# Patient Record
Sex: Female | Born: 1990 | Hispanic: No | Marital: Single | Smoking: Never smoker
Health system: Southern US, Community
[De-identification: ages and names within clinical notes are randomized; demographics above are authoritative.]

## PROBLEM LIST (undated history)

## (undated) ENCOUNTER — Inpatient Hospital Stay (HOSPITAL_COMMUNITY): Payer: Self-pay

## (undated) DIAGNOSIS — O219 Vomiting of pregnancy, unspecified: Secondary | ICD-10-CM

## (undated) DIAGNOSIS — M1990 Unspecified osteoarthritis, unspecified site: Secondary | ICD-10-CM

## (undated) DIAGNOSIS — N83209 Unspecified ovarian cyst, unspecified side: Secondary | ICD-10-CM

## (undated) DIAGNOSIS — I959 Hypotension, unspecified: Secondary | ICD-10-CM

## (undated) DIAGNOSIS — O24419 Gestational diabetes mellitus in pregnancy, unspecified control: Secondary | ICD-10-CM

## (undated) DIAGNOSIS — E282 Polycystic ovarian syndrome: Secondary | ICD-10-CM

## (undated) DIAGNOSIS — F329 Major depressive disorder, single episode, unspecified: Secondary | ICD-10-CM

## (undated) DIAGNOSIS — F419 Anxiety disorder, unspecified: Secondary | ICD-10-CM

## (undated) DIAGNOSIS — R51 Headache: Secondary | ICD-10-CM

## (undated) DIAGNOSIS — K219 Gastro-esophageal reflux disease without esophagitis: Secondary | ICD-10-CM

## (undated) DIAGNOSIS — R112 Nausea with vomiting, unspecified: Secondary | ICD-10-CM

## (undated) DIAGNOSIS — Z9889 Other specified postprocedural states: Secondary | ICD-10-CM

## (undated) DIAGNOSIS — R519 Headache, unspecified: Secondary | ICD-10-CM

## (undated) DIAGNOSIS — F32A Depression, unspecified: Secondary | ICD-10-CM

## (undated) DIAGNOSIS — D259 Leiomyoma of uterus, unspecified: Secondary | ICD-10-CM

## (undated) HISTORY — DX: Depression, unspecified: F32.A

## (undated) HISTORY — DX: Gestational diabetes mellitus in pregnancy, unspecified control: O24.419

## (undated) HISTORY — DX: Anxiety disorder, unspecified: F41.9

## (undated) HISTORY — DX: Major depressive disorder, single episode, unspecified: F32.9

## (undated) HISTORY — PX: TONSILLECTOMY: SUR1361

## (undated) HISTORY — PX: ADENOIDECTOMY: SUR15

---
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
## 2014-08-12 ENCOUNTER — Encounter (HOSPITAL_COMMUNITY): Payer: Self-pay | Admitting: Emergency Medicine

## 2014-08-12 ENCOUNTER — Emergency Department (HOSPITAL_COMMUNITY): Payer: BC Managed Care – PPO

## 2014-08-12 ENCOUNTER — Emergency Department (HOSPITAL_COMMUNITY)
Admission: EM | Admit: 2014-08-12 | Discharge: 2014-08-12 | Disposition: A | Discharge status: Home / Self Care | Payer: BC Managed Care – PPO | Attending: Emergency Medicine | Admitting: Emergency Medicine

## 2014-08-12 DIAGNOSIS — M549 Dorsalgia, unspecified: Secondary | ICD-10-CM | POA: Insufficient documentation

## 2014-08-12 DIAGNOSIS — N83209 Unspecified ovarian cyst, unspecified side: Secondary | ICD-10-CM | POA: Diagnosis not present

## 2014-08-12 DIAGNOSIS — Z3202 Encounter for pregnancy test, result negative: Secondary | ICD-10-CM | POA: Diagnosis not present

## 2014-08-12 DIAGNOSIS — N83202 Unspecified ovarian cyst, left side: Secondary | ICD-10-CM

## 2014-08-12 DIAGNOSIS — N949 Unspecified condition associated with female genital organs and menstrual cycle: Secondary | ICD-10-CM | POA: Insufficient documentation

## 2014-08-12 HISTORY — DX: Leiomyoma of uterus, unspecified: D25.9

## 2014-08-12 HISTORY — DX: Unspecified ovarian cyst, unspecified side: N83.209

## 2014-08-12 LAB — HEPATIC FUNCTION PANEL
ALBUMIN: 4 g/dL (ref 3.5–5.2)
ALT: 26 U/L (ref 0–35)
AST: 20 U/L (ref 0–37)
Alkaline Phosphatase: 71 U/L (ref 39–117)
TOTAL PROTEIN: 7.4 g/dL (ref 6.0–8.3)
Total Bilirubin: 0.3 mg/dL (ref 0.3–1.2)

## 2014-08-12 LAB — BASIC METABOLIC PANEL
Anion gap: 14 (ref 5–15)
BUN: 9 mg/dL (ref 6–23)
CALCIUM: 8.6 mg/dL (ref 8.4–10.5)
CO2: 24 mEq/L (ref 19–32)
CREATININE: 0.67 mg/dL (ref 0.50–1.10)
Chloride: 102 mEq/L (ref 96–112)
GFR calc Af Amer: >90 mL/min (ref >90)
Glucose, Bld: 98 mg/dL (ref 70–99)
Potassium: 4 mEq/L (ref 3.7–5.3)
Sodium: 140 mEq/L (ref 137–147)

## 2014-08-12 LAB — CBC
HEMATOCRIT: 42.3 % (ref 36.0–46.0)
Hemoglobin: 14.6 g/dL (ref 12.0–15.0)
MCH: 31.1 pg (ref 26.0–34.0)
MCHC: 34.5 g/dL (ref 30.0–36.0)
MCV: 90.2 fL (ref 78.0–100.0)
Platelets: 241 10*3/uL (ref 150–400)
RBC: 4.69 MIL/uL (ref 3.87–5.11)
RDW: 12.3 % (ref 11.5–15.5)
WBC: 7.2 10*3/uL (ref 4.0–10.5)

## 2014-08-12 LAB — URINALYSIS, ROUTINE W REFLEX MICROSCOPIC
BILIRUBIN URINE: NEGATIVE
Glucose, UA: NEGATIVE mg/dL
Ketones, ur: NEGATIVE mg/dL
Leukocytes, UA: NEGATIVE
NITRITE: NEGATIVE
Protein, ur: NEGATIVE mg/dL
Specific Gravity, Urine: 1.009 (ref 1.005–1.030)
UROBILINOGEN UA: 0.2 mg/dL (ref 0.0–1.0)
pH: 6.5 (ref 5.0–8.0)

## 2014-08-12 LAB — WET PREP, GENITAL
Clue Cells Wet Prep HPF POC: NONE SEEN
Trich, Wet Prep: NONE SEEN
Yeast Wet Prep HPF POC: NONE SEEN

## 2014-08-12 LAB — URINE MICROSCOPIC-ADD ON

## 2014-08-12 LAB — POC URINE PREG, ED: Preg Test, Ur: NEGATIVE

## 2014-08-12 LAB — HIV ANTIBODY (ROUTINE TESTING W REFLEX): HIV: NONREACTIVE

## 2014-08-12 LAB — LIPASE, BLOOD: Lipase: 18 U/L (ref 11–59)

## 2014-08-12 MED ORDER — MORPHINE SULFATE 2 MG/ML IJ SOLN: 2.0000 mg | Freq: Once | INTRAMUSCULAR | Status: DC

## 2014-08-12 MED ORDER — MORPHINE SULFATE 2 MG/ML IJ SOLN
2.0000 mg | Freq: Once | INTRAMUSCULAR | Status: AC
Start: 2014-08-12 — End: 2014-08-12
  Administered 2014-08-12: 2 mg via INTRAVENOUS
  Filled 2014-08-12: qty 1

## 2014-08-12 MED ORDER — IOHEXOL 300 MG/ML  SOLN
100.0000 mL | Freq: Once | INTRAMUSCULAR | Status: AC | PRN
  Administered 2014-08-12: 100 mL via INTRAVENOUS

## 2014-08-12 MED ORDER — IBUPROFEN 800 MG PO TABS: 800.0000 mg | ORAL_TABLET | Freq: Three times a day (TID) | ORAL | Status: DC

## 2014-08-12 MED ORDER — HYDROCODONE-ACETAMINOPHEN 5-325 MG PO TABS
2.0000 | ORAL_TABLET | Freq: Once | ORAL | Status: AC
  Administered 2014-08-12: 2 via ORAL
  Filled 2014-08-12: qty 2

## 2014-08-12 MED ORDER — SODIUM CHLORIDE 0.9 % IV BOLUS (SEPSIS)
1000.0000 mL | Freq: Once | INTRAVENOUS | Status: AC
  Administered 2014-08-12: 1000 mL via INTRAVENOUS

## 2014-08-12 MED ORDER — HYDROCODONE-ACETAMINOPHEN 5-325 MG PO TABS: 2.0000 | ORAL_TABLET | ORAL | Status: DC | PRN

## 2014-08-12 NOTE — Discharge Instructions (Signed)
You need to have a repeat test in 1-year to ensure that the ovarian cyst has resolved.  Ovarian Cyst An ovarian cyst is a fluid-filled sac that forms on an ovary. The ovaries are small organs that produce eggs in women. Various types of cysts can form on the ovaries. Most are not cancerous. Many do not cause problems, and they often go away on their own. Some may cause symptoms and require treatment. Common types of ovarian cysts include:  Functional cysts--These cysts may occur every month during the menstrual cycle. This is normal. The cysts usually go away with the next menstrual cycle if the woman does not get pregnant. Usually, there are no symptoms with a functional cyst.  Endometrioma cysts--These cysts form from the tissue that lines the uterus. They are also called "chocolate cysts" because they become filled with blood that turns brown. This type of cyst can cause pain in the lower abdomen during intercourse and with your menstrual period.  Cystadenoma cysts--This type develops from the cells on the outside of the ovary. These cysts can get very big and cause lower abdomen pain and pain with intercourse. This type of cyst can twist on itself, cut off its blood supply, and cause severe pain. It can also easily rupture and cause a lot of pain.  Dermoid cysts--This type of cyst is sometimes found in both ovaries. These cysts may contain different kinds of body tissue, such as skin, teeth, hair, or cartilage. They usually do not cause symptoms unless they get very big.  Theca lutein cysts--These cysts occur when too much of a certain hormone (human chorionic gonadotropin) is produced and overstimulates the ovaries to produce an egg. This is most common after procedures used to assist with the conception of a baby (in vitro fertilization). CAUSES   Fertility drugs can cause a condition in which multiple large cysts are formed on the ovaries. This is called ovarian hyperstimulation syndrome.  A  condition called polycystic ovary syndrome can cause hormonal imbalances that can lead to nonfunctional ovarian cysts. SIGNS AND SYMPTOMS  Many ovarian cysts do not cause symptoms. If symptoms are present, they may include:  Pelvic pain or pressure.  Pain in the lower abdomen.  Pain during sexual intercourse.  Increasing girth (swelling) of the abdomen.  Abnormal menstrual periods.  Increasing pain with menstrual periods.  Stopping having menstrual periods without being pregnant. DIAGNOSIS  These cysts are commonly found during a routine or annual pelvic exam. Tests may be ordered to find out more about the cyst. These tests may include:  Ultrasound.  X-ray of the pelvis.  CT scan.  MRI.  Blood tests. TREATMENT  Many ovarian cysts go away on their own without treatment. Your health care provider may want to check your cyst regularly for 2-3 months to see if it changes. For women in menopause, it is particularly important to monitor a cyst closely because of the higher rate of ovarian cancer in menopausal women. When treatment is needed, it may include any of the following:  A procedure to drain the cyst (aspiration). This may be done using a long needle and ultrasound. It can also be done through a laparoscopic procedure. This involves using a thin, lighted tube with a tiny camera on the end (laparoscope) inserted through a small incision.  Surgery to remove the whole cyst. This may be done using laparoscopic surgery or an open surgery involving a larger incision in the lower abdomen.  Hormone treatment or birth control pills.  These methods are sometimes used to help dissolve a cyst. HOME CARE INSTRUCTIONS   Only take over-the-counter or prescription medicines as directed by your health care provider.  Follow up with your health care provider as directed.  Get regular pelvic exams and Pap tests. SEEK MEDICAL CARE IF:   Your periods are late, irregular, or painful, or  they stop.  Your pelvic pain or abdominal pain does not go away.  Your abdomen becomes larger or swollen.  You have pressure on your bladder or trouble emptying your bladder completely.  You have pain during sexual intercourse.  You have feelings of fullness, pressure, or discomfort in your stomach.  You lose weight for no apparent reason.  You feel generally ill.  You become constipated.  You lose your appetite.  You develop acne.  You have an increase in body and facial hair.  You are gaining weight, without changing your exercise and eating habits.  You think you are pregnant. SEEK IMMEDIATE MEDICAL CARE IF:   You have increasing abdominal pain.  You feel sick to your stomach (nauseous), and you throw up (vomit).  You develop a fever that comes on suddenly.  You have abdominal pain during a bowel movement.  Your menstrual periods become heavier than usual. MAKE SURE YOU:  Understand these instructions.  Will watch your condition.  Will get help right away if you are not doing well or get worse. Document Released: 11/30/2005 Document Revised: 12/05/2013 Document Reviewed: 08/07/2013 [facility] Patient Information 2015 [place], [place]. This information is not intended to replace advice given to you by your health care provider. Make sure you discuss any questions you have with your health care provider.

## 2014-08-12 NOTE — ED Provider Notes (Signed)
Medical screening examination/treatment/procedure(s) were performed by non-physician practitioner and as supervising physician I was immediately available for consultation/collaboration.   EKG Interpretation None        Orpah Greek, MD 08/12/14 [phone]
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
## 2014-08-12 NOTE — ED Notes (Signed)
PT REQUESTING PAIN MEDS 

## 2014-08-12 NOTE — ED Notes (Signed)
CT aware pt finished drinking contrast 
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
## 2014-08-12 NOTE — ED Notes (Signed)
Brought pt to room with friend in tow; pt getting undressed and into a gown at this time; [place], [place] aware

## 2014-08-13 NOTE — ED Provider Notes (Signed)
Medical screening examination/treatment/procedure(s) were performed by non-physician practitioner and as supervising physician I was immediately available for consultation/collaboration.   EKG Interpretation None        Fredia Sorrow, MD 08/13/14 1126

## 2014-08-14 LAB — GC/CHLAMYDIA PROBE AMP
CT Probe RNA: NEGATIVE
GC PROBE AMP APTIMA: NEGATIVE

## 2015-02-16 ENCOUNTER — Telehealth: Payer: Self-pay | Admitting: Obstetrics and Gynecology

## 2015-02-16 NOTE — Telephone Encounter (Signed)
TC from patient of [facility]--7 weeks, with constipation.  Hx of some constipation in past, small BM today, but hasn't had "good BM" in several days.  Took single dose Zofran yesterday.  Able to drink liquids.  Has taken MOM without relief.    Recommended Dulcolax supp now, then Miralax daily. Push fluids, apple/prune juice combo heated up.  Currently in [place].  Will f/u prn.  Donnel Saxon, CNM 02/16/15 12:55

## 2015-03-01 LAB — OB RESULTS CONSOLE HEPATITIS B SURFACE ANTIGEN: Hepatitis B Surface Ag: NEGATIVE

## 2015-03-01 LAB — OB RESULTS CONSOLE RUBELLA ANTIBODY, IGM: Rubella: NON-IMMUNE/NOT IMMUNE

## 2015-03-01 LAB — OB RESULTS CONSOLE ABO/RH: RH TYPE: NEGATIVE

## 2015-03-01 LAB — OB RESULTS CONSOLE ANTIBODY SCREEN: ANTIBODY SCREEN: NEGATIVE

## 2015-03-01 LAB — OB RESULTS CONSOLE RPR: RPR: NONREACTIVE

## 2015-03-01 LAB — OB RESULTS CONSOLE HIV ANTIBODY (ROUTINE TESTING): HIV: NONREACTIVE

## 2015-03-15 ENCOUNTER — Other Ambulatory Visit: Payer: Self-pay | Admitting: Obstetrics & Gynecology

## 2015-03-15 ENCOUNTER — Other Ambulatory Visit (HOSPITAL_COMMUNITY)
Admission: RE | Admit: 2015-03-15 | Discharge: 2015-03-15 | Disposition: A | Discharge status: Home / Self Care | Payer: BLUE CROSS/BLUE SHIELD | Source: Ambulatory Visit | Attending: Obstetrics & Gynecology | Admitting: Obstetrics & Gynecology

## 2015-03-15 DIAGNOSIS — Z01419 Encounter for gynecological examination (general) (routine) without abnormal findings: Secondary | ICD-10-CM | POA: Insufficient documentation

## 2015-03-15 DIAGNOSIS — Z113 Encounter for screening for infections with a predominantly sexual mode of transmission: Secondary | ICD-10-CM | POA: Insufficient documentation

## 2015-03-27 LAB — CYTOLOGY - PAP

## 2015-03-29 ENCOUNTER — Other Ambulatory Visit (HOSPITAL_COMMUNITY): Payer: Self-pay | Admitting: Obstetrics & Gynecology

## 2015-03-29 ENCOUNTER — Ambulatory Visit (HOSPITAL_COMMUNITY)
Admission: RE | Admit: 2015-03-29 | Discharge: 2015-03-29 | Disposition: A | Discharge status: Home / Self Care | Payer: BLUE CROSS/BLUE SHIELD | Source: Ambulatory Visit | Attending: Obstetrics & Gynecology | Admitting: Obstetrics & Gynecology

## 2015-03-29 ENCOUNTER — Encounter (HOSPITAL_COMMUNITY): Payer: Self-pay

## 2015-03-29 ENCOUNTER — Encounter (HOSPITAL_COMMUNITY): Payer: Self-pay | Admitting: Obstetrics & Gynecology

## 2015-03-29 VITALS — BP 115/66 | HR 88

## 2015-03-29 DIAGNOSIS — Z3A12 12 weeks gestation of pregnancy: Secondary | ICD-10-CM | POA: Diagnosis not present

## 2015-03-29 DIAGNOSIS — Z832 Family history of diseases of the blood and blood-forming organs and certain disorders involving the immune mechanism: Secondary | ICD-10-CM | POA: Insufficient documentation

## 2015-03-29 DIAGNOSIS — Z862 Personal history of diseases of the blood and blood-forming organs and certain disorders involving the immune mechanism: Secondary | ICD-10-CM

## 2015-03-29 DIAGNOSIS — O9989 Other specified diseases and conditions complicating pregnancy, childbirth and the puerperium: Secondary | ICD-10-CM | POA: Diagnosis not present

## 2015-03-29 NOTE — Progress Notes (Signed)
24 year old, G1, at 12 weeks 4 days gestation with an EDD of 10/08/2015 seen in consult for a possible family history of protein C deficiency.   She notes that this pregnancy was unplanned, but has had no complications to date other than nausea for which she takes. No records were sent for review from her primary provider.   Her sister is about to deliver and has had numerous miscarriages in [place] (no records here) and was evaluated this pregnancy (baby has trisomy 98) with a thrombophilia panel. 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 She is not on heparin and is taking on daily 81mg  aspirin. She and her family have no evidence of DVT or PE. No history of IUGR or stillbirth or obstetric complication.   PMHx   Low blood pressure PSHx  Tonsils age 54   Fractured wrist age 55 SHx  Social alcohol. No tobacco, medications, illicit drugs or herbal medications Meds  PNV 1 po daily   Zofran 4mg  q6 prn FMHx  Thyroid mother   Cardiac, stroke MGM and MGF   No DVT or PE Allergies  Sulfa which causes chest pressure after a day or so  #1 ? Protein C deficiency in her sister  Testing was during and may be related to pregnancy. They did not feel significant enough risk to sister for anticoagulation given only a history of miscarriage. Would not recommend thrombophilia testing in the patient given family history. We discussed the incidence or Protein C in the general population, incidence of thrombotic events in pregnancy, risk of prophylactic and therapeutic anticoagulation in pregnancy, and management is controversial given no significant family history should she have Protein C on testing (which is often reduced secondary to pregnancy). She and her father appear to agree with this management.  #2 Sister with a child with Trisomy 52  We discussed her age related risk of aneuploidy. We discussed 1st and 2nd  trimester screening (biochemical and sonographic) vs diagnostic testing for the evaluation of fetal aneuploidy. Her sisters risk of recurrence is ~1-2%, but this does not put her at increased risk for a child with aneuploidy.   Questions appear answered to her and her fathers satisfaction. Precautions for the above given. Spent greater than 1/2 of 30 minute visit face to face counseling.

## 2015-07-17 ENCOUNTER — Encounter: Payer: Medicaid Other | Attending: Obstetrics & Gynecology

## 2015-07-17 VITALS — Ht 66.0 in

## 2015-07-17 DIAGNOSIS — O24419 Gestational diabetes mellitus in pregnancy, unspecified control: Secondary | ICD-10-CM | POA: Insufficient documentation

## 2015-07-17 DIAGNOSIS — Z713 Dietary counseling and surveillance: Secondary | ICD-10-CM | POA: Diagnosis not present

## 2015-07-17 NOTE — Progress Notes (Signed)
  Patient was seen on 07/17/15 for Gestational Diabetes self-management . The following learning objectives were met by the patient :   States the definition of Gestational Diabetes  States why dietary management is important in controlling blood glucose  Describes the effects of carbohydrates on blood glucose levels  Demonstrates ability to create a balanced meal plan  Demonstrates carbohydrate counting   States when to check blood glucose levels  Demonstrates proper blood glucose monitoring techniques  States the effect of stress and exercise on blood glucose levels  States the importance of limiting caffeine and abstaining from alcohol and smoking  Plan:  Aim for 2 Carb Choices per meal (30 grams) +/- 1 either way for breakfast Aim for 3 Carb Choices per meal (45 grams) +/- 1 either way from lunch and dinner Aim for 1-2 Carbs per snack Begin reading food labels for Total Carbohydrate and sugar grams of foods Consider  increasing your activity level by walking daily as tolerated Begin checking BG before breakfast and 2 hours after first bit of breakfast, lunch and dinner after  as directed by MD  Take medication  as directed by MD  Blood glucose monitor given: None - Testing per MD  Patient instructed to monitor glucose levels: FBS: 60 - <90 2 hour: <120  Patient received the following handouts:  Nutrition Diabetes and Pregnancy  Carbohydrate Counting List  Meal Planning worksheet  Patient will be seen for follow-up as needed.

## 2015-07-27 ENCOUNTER — Inpatient Hospital Stay (HOSPITAL_COMMUNITY): Payer: BLUE CROSS/BLUE SHIELD

## 2015-07-27 ENCOUNTER — Encounter (HOSPITAL_COMMUNITY): Payer: Self-pay | Admitting: *Deleted

## 2015-07-27 ENCOUNTER — Inpatient Hospital Stay (HOSPITAL_COMMUNITY)
Admission: AD | Admit: 2015-07-27 | Discharge: 2015-07-27 | Disposition: A | Discharge status: Home / Self Care | Payer: BLUE CROSS/BLUE SHIELD | Source: Ambulatory Visit | Attending: Obstetrics & Gynecology | Admitting: Obstetrics & Gynecology

## 2015-07-27 ENCOUNTER — Telehealth: Payer: Self-pay

## 2015-07-27 DIAGNOSIS — Z3A29 29 weeks gestation of pregnancy: Secondary | ICD-10-CM | POA: Insufficient documentation

## 2015-07-27 DIAGNOSIS — O1203 Gestational edema, third trimester: Secondary | ICD-10-CM | POA: Diagnosis not present

## 2015-07-27 DIAGNOSIS — M7989 Other specified soft tissue disorders: Secondary | ICD-10-CM | POA: Diagnosis not present

## 2015-07-27 DIAGNOSIS — M79609 Pain in unspecified limb: Secondary | ICD-10-CM

## 2015-07-27 DIAGNOSIS — M79606 Pain in leg, unspecified: Secondary | ICD-10-CM | POA: Diagnosis present

## 2015-07-27 DIAGNOSIS — M79605 Pain in left leg: Secondary | ICD-10-CM

## 2015-07-27 DIAGNOSIS — O26893 Other specified pregnancy related conditions, third trimester: Secondary | ICD-10-CM

## 2015-07-27 DIAGNOSIS — O9989 Other specified diseases and conditions complicating pregnancy, childbirth and the puerperium: Secondary | ICD-10-CM | POA: Insufficient documentation

## 2015-07-27 LAB — URINALYSIS, ROUTINE W REFLEX MICROSCOPIC
Bilirubin Urine: NEGATIVE
GLUCOSE, UA: NEGATIVE mg/dL
HGB URINE DIPSTICK: NEGATIVE
Ketones, ur: NEGATIVE mg/dL
Nitrite: NEGATIVE
Protein, ur: NEGATIVE mg/dL
Urobilinogen, UA: 0.2 mg/dL (ref 0.0–1.0)
pH: 6.5 (ref 5.0–8.0)

## 2015-07-27 LAB — URINE MICROSCOPIC-ADD ON

## 2015-07-27 MED ORDER — ACETAMINOPHEN 325 MG PO TABS
650.0000 mg | ORAL_TABLET | ORAL | Status: AC
  Administered 2015-07-27: 650 mg via ORAL
  Filled 2015-07-27: qty 2

## 2015-07-27 MED ORDER — CYCLOBENZAPRINE HCL 10 MG PO TABS
10.0000 mg | ORAL_TABLET | Freq: Once | ORAL | Status: AC
  Administered 2015-07-27: 10 mg via ORAL
  Filled 2015-07-27: qty 1

## 2015-07-27 MED ORDER — CYCLOBENZAPRINE HCL 10 MG PO TABS: 10.0000 mg | ORAL_TABLET | Freq: Three times a day (TID) | ORAL | Status: AC | PRN

## 2015-07-27 NOTE — MAU Provider Note (Signed)
History     CSN: [id number]  Arrival date and time: 07/27/15 0700   First Provider Initiated Contact with Patient 07/27/15 [phone]      Chief Complaint  Patient presents with  . Leg Pain   HPI Kim Davidson 24 y.o. G1P0 @[redacted]w[redacted]d  presents to MAU complaining of left leg cramping.  It woke her up at 1:30am with terrible pain - 10/10. She reports terrible pain for 10-15 minutes but she still has significant pain with pulsing and swelling of that leg.  It is limited to lower leg/calf.  She has had leg cramps before but this is the worst and this is the first time with swelling.  She could not go back to sleep due to pain.  It hurts to walk but she can move the extremity.  Denies nausea, vomiting, LOF, vaginal bleeding, dysuria.  Baby is moving well.   OB History    Gravida Para Term Preterm AB TAB SAB Ectopic Multiple Living   1               Past Medical History  Diagnosis Date  . Fibroid, uterine   . Ovarian cyst   . Gestational diabetes mellitus, antepartum     Past Surgical History  Procedure Laterality Date  . Tonsillectomy      History reviewed. No pertinent family history.  Social History  Substance Use Topics  . Smoking status: Never Smoker   . Smokeless tobacco: None  . Alcohol Use: Yes    Allergies:  Allergies  Allergen Reactions  . Demerol [Meperidine] Nausea Only  . Sulfa Antibiotics Other (See Comments)    Chest pain    Prescriptions prior to admission  Medication Sig Dispense Refill Last Dose  . acetaminophen (TYLENOL) 500 MG tablet Take 500 mg by mouth every 6 (six) hours as needed for mild pain or headache.   07/27/2015 at Unknown time  . calcium carbonate (TUMS - DOSED IN MG ELEMENTAL CALCIUM) 500 MG chewable tablet Chew 2 tablets by mouth daily.   07/27/2015 at Unknown time  . Doxylamine-Pyridoxine (DICLEGIS) 10-10 MG TBEC Take 1-2 tablets by mouth 2 (two) times daily.   07/26/2015 at Unknown time  . HYDROcodone-acetaminophen (NORCO/VICODIN) 5-325 MG  per tablet Take 2 tablets by mouth every 4 (four) hours as needed. (Patient not taking: Reported on 03/29/2015) 10 tablet 0 Not Taking  . ibuprofen (ADVIL,MOTRIN) 800 MG tablet Take 1 tablet (800 mg total) by mouth 3 (three) times daily. (Patient not taking: Reported on 03/29/2015) 21 tablet 0 Not Taking  . ondansetron (ZOFRAN) 4 MG tablet Take 4 mg by mouth every 8 (eight) hours as needed for nausea or vomiting.   prn    ROS Pertinent ROS in HPI.  All other systems are negative.   Physical Exam   Blood pressure 130/70, pulse 93, temperature 98.2 F (36.8 C), resp. rate 18, height 5\' 6"  (1.676 m), weight 282 lb 4 oz (128.028 kg), SpO2 99 %.  Physical Exam  Constitutional: She is oriented to person, place, and time. She appears well-developed and well-nourished. No distress.  HENT:  Head: Normocephalic and atraumatic.  Eyes: EOM are normal.  Neck: Normal range of motion.  Cardiovascular: Normal rate.   Right calf measuring 48cm Left calf measuring 52-53cm.   No warmth or erythema appreciated  Respiratory: Breath sounds normal. No respiratory distress.  GI: Soft. There is no tenderness.  Musculoskeletal: Normal range of motion.  Neurological: She is alert and oriented to person, place, and time.  Skin: Skin is warm and dry.    MAU Course  Procedures  MDM Fetal Tracing: Baseline:130 Variability:appropriate Accelerations: 10x10 Decelerations:none Toco:none   Discussed with Dr. Alesia Richards.  She advises for Tylenol, Flexeril and Doppler studies to eval for DVT.  Slight relief from medications.  Swelling and discomfort persist Per doppler study: no DVT.  Dr. Alesia Richards advised and agreeable to discharge with further Flexeril and f/u in office  Assessment and Plan  A:  1. Pregnancy related leg pain in third trimester, antepartum   2. Pain and swelling of left lower extremity    P: Discharge to home Warning signs discussed.  Immediately return should problems arise.  F/u in office  asap  Paticia Stack 07/27/2015, 7:54 AM

## 2015-07-27 NOTE — MAU Note (Signed)
Pt presents to MAU with complaints of left leg pain. Pt states that she woke up around 130 this morning and had a bad cramp in her left leg and then noticed it was swollen. Denies any vaginal bleeding or LOF

## 2015-07-27 NOTE — Telephone Encounter (Signed)
Patient states she is having pain in her left leg and woke up screaming at about 0130.  Patient states cramping lasted about 10-15 minutes, but she is still experiencing "severe" muscle swelling.  Patient states she has noticeable swelling in left leg compared to right.  Patient denies bruising and warmth to touch, but states it is "very tight."  Patient states she took tylenol at 0330 and pain decreased from 6-7/10 to 3-4/10.  Patient states that upon examination she has "bulging veins," but denies history of varicose veins.  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  Patient states she does to wear any type of support or compression stockings.  Patient reports active fetus and denies contractions, LOF, and VB.  Patient instructed to report to MAU for evaluation.  Patient states it will take her about 45 minutes, but that she will be in.  No other questions or concerns.  JE, CNM

## 2015-07-27 NOTE — Discharge Instructions (Signed)
Third Trimester of Pregnancy The third trimester is from week 29 through week 42, months 7 through 9. The third trimester is a time when the fetus is growing rapidly. At the end of the ninth month, the fetus is about 20 inches in length and weighs 6-10 pounds.  BODY CHANGES Your body goes through many changes during pregnancy. The changes vary from woman to woman.   Your weight will continue to increase. You can expect to gain 25-35 pounds (11-16 kg) by the end of the pregnancy.  You may begin to get stretch marks on your hips, abdomen, and breasts.  You may urinate more often because the fetus is moving lower into your pelvis and pressing on your bladder.  You may develop or continue to have heartburn as a result of your pregnancy.  You may develop constipation because certain hormones are causing the muscles that push waste through your intestines to slow down.  You may develop hemorrhoids or swollen, bulging veins (varicose veins).  You may have pelvic pain because of the weight gain and pregnancy hormones relaxing your joints between the bones in your pelvis. Backaches may result from overexertion of the muscles supporting your posture.  You may have changes in your hair. These can include thickening of your hair, rapid growth, and changes in texture. Some women also have hair loss during or after pregnancy, or hair that feels dry or thin. Your hair will most likely return to normal after your baby is born.  Your breasts will continue to grow and be tender. A yellow discharge may leak from your breasts called colostrum.  Your belly button may stick out.  You may feel short of breath because of your expanding uterus.  You may notice the fetus "dropping," or moving lower in your abdomen.  You may have a bloody mucus discharge. This usually occurs a few days to a week before labor begins.  Your cervix becomes thin and soft (effaced) near your due date. WHAT TO EXPECT AT YOUR PRENATAL  EXAMS  You will have prenatal exams every 2 weeks until week 36. Then, you will have weekly prenatal exams. During a routine prenatal visit:  You will be weighed to make sure you and the fetus are growing normally.  Your blood pressure is taken.  Your abdomen will be measured to track your baby's growth.  The fetal heartbeat will be listened to.  Any test results from the previous visit will be discussed.  You may have a cervical check near your due date to see if you have effaced. At around 36 weeks, your caregiver will check your cervix. At the same time, your caregiver will also perform a test on the secretions of the vaginal tissue. This test is to determine if a type of bacteria, Group B streptococcus, is present. Your caregiver will explain this further. Your caregiver may ask you:  What your birth plan is.  How you are feeling.  If you are feeling the baby move.  If you have had any abnormal symptoms, such as leaking fluid, bleeding, severe headaches, or abdominal cramping.  If you have any questions. Other tests or screenings that may be performed during your third trimester include:  Blood tests that check for low iron levels (anemia).  Fetal testing to check the health, activity level, and growth of the fetus. Testing is done if you have certain medical conditions or if there are problems during the pregnancy. FALSE LABOR You may feel small, irregular contractions that   eventually go away. These are called Braxton Hicks contractions, or false labor. Contractions may last for hours, days, or even weeks before true labor sets in. If contractions come at regular intervals, intensify, or become painful, it is best to be seen by your caregiver.  SIGNS OF LABOR   Menstrual-like cramps.  Contractions that are 5 minutes apart or less.  Contractions that start on the top of the uterus and spread down to the lower abdomen and back.  A sense of increased pelvic pressure or back  pain.  A watery or bloody mucus discharge that comes from the vagina. If you have any of these signs before the 37th week of pregnancy, call your caregiver right away. You need to go to the hospital to get checked immediately. HOME CARE INSTRUCTIONS   Avoid all smoking, herbs, alcohol, and unprescribed drugs. These chemicals affect the formation and growth of the baby.  Follow your caregiver's instructions regarding medicine use. There are medicines that are either safe or unsafe to take during pregnancy.  Exercise only as directed by your caregiver. Experiencing uterine cramps is a good sign to stop exercising.  Continue to eat regular, healthy meals.  Wear a good support bra for breast tenderness.  Do not use hot tubs, steam rooms, or saunas.  Wear your seat belt at all times when driving.  Avoid raw meat, uncooked cheese, cat litter boxes, and soil used by cats. These carry germs that can cause birth defects in the baby.  Take your prenatal vitamins.  Try taking a stool softener (if your caregiver approves) if you develop constipation. Eat more high-fiber foods, such as fresh vegetables or fruit and whole grains. Drink plenty of fluids to keep your urine clear or pale yellow.  Take warm sitz baths to soothe any pain or discomfort caused by hemorrhoids. Use hemorrhoid cream if your caregiver approves.  If you develop varicose veins, wear support hose. Elevate your feet for 15 minutes, 3-4 times a day. Limit salt in your diet.  Avoid heavy lifting, wear low heal shoes, and practice good posture.  Rest a lot with your legs elevated if you have leg cramps or low back pain.  Visit your dentist if you have not gone during your pregnancy. Use a soft toothbrush to brush your teeth and be gentle when you floss.  A sexual relationship may be continued unless your caregiver directs you otherwise.  Do not travel far distances unless it is absolutely necessary and only with the approval  of your caregiver.  Take prenatal classes to understand, practice, and ask questions about the labor and delivery.  Make a trial run to the hospital.  Pack your hospital bag.  Prepare the baby's nursery.  Continue to go to all your prenatal visits as directed by your caregiver. SEEK MEDICAL CARE IF:  You are unsure if you are in labor or if your water has broken.  You have dizziness.  You have mild pelvic cramps, pelvic pressure, or nagging pain in your abdominal area.  You have persistent nausea, vomiting, or diarrhea.  You have a bad smelling vaginal discharge.  You have pain with urination. SEEK IMMEDIATE MEDICAL CARE IF:   You have a fever.  You are leaking fluid from your vagina.  You have spotting or bleeding from your vagina.  You have severe abdominal cramping or pain.  You have rapid weight loss or gain.  You have shortness of breath with chest pain.  You notice sudden or extreme swelling   of your face, hands, ankles, feet, or legs.  You have not felt your baby move in over an hour.  You have severe headaches that do not go away with medicine.  You have vision changes. Document Released: 11/24/2001 Document Revised: 12/05/2013 Document Reviewed: 01/31/2013 ExitCare Patient Information 2015 [employer]. This information is not intended to replace advice given to you by your health care provider. Make sure you discuss any questions you have with your health care provider.  

## 2015-07-27 NOTE — Progress Notes (Signed)
Left lower extremity venous duplex completed.  Left:  No obvious evidence of DVT, superficial thrombosis, or Baker's cyst.  Right:  Negative for DVT in the common femoral vein.  Technically difficult study due to the patient's body habitus.  

## 2015-08-21 ENCOUNTER — Telehealth: Payer: Self-pay

## 2015-08-21 NOTE — Telephone Encounter (Signed)
Patient calls reporting feeling dizzy, clammy, and started projectile vomiting this afternoon.  Patient further states that after this incident she noticed a headache.  Patient reports blood sugars are normal at 102 (PP) and BP was 94/60. Patient reports that headache is currently 6-7/10 and that she has not attempted to take anything due to "really bad gag reflex."  Patient denies other issues and reports active fetus while denying cramping, contractions, LoF, VB, epigastic pain, SOB, double vision, and blurry vision, but states "my vision is not normal."  Patient states she has been properly hydrating, but has not had any fluids since throwing up.  Patient instructed to take tylenol and hydrate to see if headache resolves. Patient further instructed to eat if she has not had anything recently. Patient to call back if headache worsens, not improved after 3 hours, or other symptoms arise.  No questions or concerns.  JE, CNM

## 2015-09-24 LAB — OB RESULTS CONSOLE GBS: GBS: POSITIVE

## 2015-10-01 ENCOUNTER — Encounter (HOSPITAL_COMMUNITY): Payer: Self-pay | Admitting: *Deleted

## 2015-10-01 ENCOUNTER — Telehealth (HOSPITAL_COMMUNITY): Payer: Self-pay | Admitting: *Deleted

## 2015-10-01 NOTE — H&P (Signed)
  HPI: 24 y/o G1P0 @ 48w2destimated gestational age (as dated by 7 week ultrasound) presents for scheduled primary C-section due to suspected LGA and CPD. no Leaking of Fluid,   no Vaginal Bleeding,   no Uterine Contractions,  + Fetal Movement.  ROS: NO HA, NO epigastric pain, NO visual changes.    Pregnancy complicated by: 1) GDMA1- diet controlled -followed by growth and NST weekly at [facility]2) Suspected LGA -last [place]@ 364w6dvertex, posterior, EFW: 4161g (>90%) 3) Obesity: BMI: 49 4) Anxiety/Depression: previously on zoloft 5044maily, self-discontinued for part of pregnancy, plan to restart postpartum 5) O negative, s/p RhoGAM @ 28wks 6) Rubella non-immune, will need MMR postpartum   Prenatal Transfer Tool  Maternal Diabetes: Yes:  Diabetes Type:  Diet controlled Genetic Screening: Normal Maternal Ultrasounds/Referrals: Normal Fetal Ultrasounds or other Referrals:  None Maternal Substance Abuse:  No Significant Maternal Medications:  Meds include: Zoloft Significant Maternal Lab Results: Lab values include: Group B Strep positive, Rh negative   PNL:  GBS positive, Rub NON-IMMUNE, Hep B neg, RPR NR, HIV neg, GC/C neg, glucola:abnormal Blood type: O negative, antibody neg VACCINATIONS: Tdap-07/24/15 Flu-09/06/15   OBHx: primip PMHx:  obesity Meds:  PNV, zoloft (self discontinued) Allergy:   Allergies  Allergen Reactions  . Demerol [Meperidine] Nausea Only  . Sulfa Antibiotics Other (See Comments)    Chest pain   SurgHx: none SocHx:   no Tobacco, no  EtOH, no Illicit Drugs  O: Last performed in office on 10/17: BMI: 49, HR: BP: 113/71 Gen. AAOx3, NAD CV.  RRR Abd. Gravid,  no tenderness,  no rigidity,  no guarding Extr.  1+ pitting edema, no calf tenderness bilaterally  FHT: 145bpm SVE: closed/long/high   Labs: see orders  A/P:  24 73o. G1P0 @ 39w35w2d who presents for primary C-section due to LGA and suspected CPD -FWB:  Reassuring by doppler -On exam  concern for LGA with US s[place]estive of LGA >90%.  Reviewed risk and benefit of CD vs vaginal delivery.  Reviewed risk of Cesarean delivery (CD) vs concerns for labor complications including shoulder dystocia. Questions and concerns were addressed as pt wishes to proceed with primary CD.  Inform consent was obtained after reviewed of risk of injury, infection and bleeding. -CBC, T&S, accu-check to be obtained -NPO -LR @ 125cc/hr -SCDs to OR -Ancef 3g IV to OR  [place] [phone]) [phone])

## 2015-10-01 NOTE — Telephone Encounter (Signed)
Preadmission screen  

## 2015-10-02 ENCOUNTER — Inpatient Hospital Stay (HOSPITAL_COMMUNITY)
Admission: AD | Admit: 2015-10-02 | Discharge: 2015-10-02 | Disposition: A | Discharge status: Home / Self Care | Payer: Medicaid Other | Source: Ambulatory Visit | Attending: Obstetrics and Gynecology | Admitting: Obstetrics and Gynecology

## 2015-10-02 ENCOUNTER — Encounter (HOSPITAL_COMMUNITY): Payer: Self-pay | Admitting: Anesthesiology

## 2015-10-02 ENCOUNTER — Encounter (HOSPITAL_COMMUNITY): Payer: Self-pay

## 2015-10-02 ENCOUNTER — Encounter (HOSPITAL_COMMUNITY)
Admission: RE | Admit: 2015-10-02 | Discharge: 2015-10-02 | Disposition: A | Discharge status: Home / Self Care | Payer: Medicaid Other | Source: Ambulatory Visit | Attending: Obstetrics & Gynecology | Admitting: Obstetrics & Gynecology

## 2015-10-02 ENCOUNTER — Encounter (HOSPITAL_COMMUNITY): Payer: Self-pay | Admitting: *Deleted

## 2015-10-02 ENCOUNTER — Telehealth: Payer: Self-pay

## 2015-10-02 HISTORY — DX: Unspecified osteoarthritis, unspecified site: M19.90

## 2015-10-02 HISTORY — DX: Hypotension, unspecified: I95.9

## 2015-10-02 HISTORY — DX: Other specified postprocedural states: Z98.890

## 2015-10-02 HISTORY — DX: Nausea with vomiting, unspecified: R11.2

## 2015-10-02 HISTORY — DX: Gastro-esophageal reflux disease without esophagitis: K21.9

## 2015-10-02 HISTORY — DX: Headache: R51

## 2015-10-02 HISTORY — DX: Polycystic ovarian syndrome: E28.2

## 2015-10-02 HISTORY — DX: Headache, unspecified: R51.9

## 2015-10-02 HISTORY — DX: Vomiting of pregnancy, unspecified: O21.9

## 2015-10-02 LAB — CBC
HCT: 35.6 % — ABNORMAL LOW (ref 36.0–46.0)
HEMOGLOBIN: 11.8 g/dL — AB (ref 12.0–15.0)
MCH: 29.1 pg (ref 26.0–34.0)
MCHC: 33.1 g/dL (ref 30.0–36.0)
MCV: 87.7 fL (ref 78.0–100.0)
Platelets: 215 10*3/uL (ref 150–400)
RBC: 4.06 MIL/uL (ref 3.87–5.11)
RDW: 13.9 % (ref 11.5–15.5)
WBC: 12.1 10*3/uL — ABNORMAL HIGH (ref 4.0–10.5)

## 2015-10-02 LAB — BASIC METABOLIC PANEL
Anion gap: 5 (ref 5–15)
BUN: 10 mg/dL (ref 6–20)
CALCIUM: 8.6 mg/dL — AB (ref 8.9–10.3)
CO2: 20 mmol/L — ABNORMAL LOW (ref 22–32)
CREATININE: 0.6 mg/dL (ref 0.44–1.00)
Chloride: 108 mmol/L (ref 101–111)
Glucose, Bld: 78 mg/dL (ref 65–99)
Potassium: 3.8 mmol/L (ref 3.5–5.1)
SODIUM: 133 mmol/L — AB (ref 135–145)

## 2015-10-02 LAB — ABO/RH: ABO/RH(D): O NEG

## 2015-10-02 MED ORDER — DEXTROSE 5 % IV SOLN
3.0000 g | INTRAVENOUS | Status: AC
  Administered 2015-10-03: 3 g via INTRAVENOUS
  Filled 2015-10-02: qty 3000

## 2015-10-02 NOTE — Anesthesia Preprocedure Evaluation (Addendum)
Anesthesia Evaluation  Patient identified by MRN, date of birth, ID band Patient awake    Reviewed: Allergy & Precautions, H&P , NPO status , Patient's Chart, lab work & pertinent test results  History of Anesthesia Complications (+) PONV and history of anesthetic complications  Airway Mallampati: III  TM Distance: >3 FB Neck ROM: Full    Dental  (+) Teeth Intact   Pulmonary neg pulmonary ROS,    breath sounds clear to auscultation       Cardiovascular negative cardio ROS   Rhythm:Regular Rate:Normal     Neuro/Psych  Headaches,    GI/Hepatic negative GI ROS, Neg liver ROS, GERD  Medicated and Controlled,  Endo/Other  negative endocrine ROSdiabetes, GestationalMorbid obesity  Renal/GU negative Renal ROS     Musculoskeletal   Abdominal (+) + obese,   Peds  Hematology negative hematology ROS (+)   Anesthesia Other Findings   Reproductive/Obstetrics (+) Pregnancy                         Anesthesia Physical Anesthesia Plan  ASA: III  Anesthesia Plan: Spinal and Combined Spinal and Epidural   Post-op Pain Management:    Induction:   Airway Management Planned:   Additional Equipment:   Intra-op Plan:   Post-operative Plan:   Informed Consent:   Plan Discussed with: CRNA and Surgeon  Anesthesia Plan Comments:         Anesthesia Quick Evaluation

## 2015-10-02 NOTE — Patient Instructions (Signed)
Your procedure is scheduled on:  Tomorrow, October 03, 2015  Enter through the [employer] of [facility] at: 6:00 A.M.  Pick up the phone at the desk and dial [phone].  Call this number if you have problems the morning of surgery: [phone].  Remember: Do NOT eat food or drink after: MIDNIGHT TONIGHT Take these medicines the morning of surgery with a SIP OF WATER: NONE  Do NOT wear jewelry (body piercing), metal hair clips/bobby pins, or nail polish. Do NOT wear lotions, powders, or perfumes.  You may wear deoderant. Do NOT shave for 48 hours prior to surgery. Do NOT bring valuables to the hospital.  Leave suitcase in car.  After surgery it may be brought to your room.  For patients admitted to the hospital, checkout time is 11:00 AM the day of discharge.

## 2015-10-02 NOTE — Telephone Encounter (Signed)
Patient called reporting irregular contractions since 10pm.  Patient states she contractions are ow every 7-8 minutes, lasting a minute, for the last hour.  Patient reports good fetal movement and denies VB and LOF. Patient reports that she is a GDM-A1 and is scheduled for a c/s on Thursday 10/20. Patient reports that she lives over an hour away.  Patient states contraction pain is a 6 or 7 when they occur.  Patient reports that her cervix was closed the last time she was checked in the office.  Patient encouraged to hydrate and elevate then call back in 2 hours or sooner if contractions get closer together.  Patient verbalized understanding.  No other questions or concerns. JE, CNM

## 2015-10-02 NOTE — Discharge Instructions (Signed)
Keep your schedule appointment for C/S. Call Dr. Nelda Marseille with further concerns or return to MAU as necessary.

## 2015-10-02 NOTE — Patient Instructions (Signed)
Your procedure is scheduled on:  Tomorrow, October 03, 2015  Enter through the [employer] [facility] at: 6:00 A.M.  Pick up the phone at the desk and dial [phone].  Call this number if you have problems the morning of surgery: [phone].  Remember: Do NOT eat food or drink after: MIDNIGHT TONIGHT Take these medicines the morning of surgery with a SIP OF WATER: NONE  Do NOT wear jewelry (body piercing), metal hair clips/bobby pins, or nail polish. Do NOT wear lotions, powders, or perfumes.  You may wear deoderant. Do NOT shave for 48 hours prior to surgery. Do NOT bring valuables to the hospital.  Leave suitcase in car.  After surgery it may be brought to your room.  For patients admitted to the hospital, checkout time is 11:00 AM the day of discharge.

## 2015-10-02 NOTE — MAU Note (Signed)
C/O contractions. No bleeding or leaking. Planning primary C/S, scheduled for tomorrow due to gestational diabetes and suspected macrosomia.

## 2015-10-03 ENCOUNTER — Inpatient Hospital Stay (HOSPITAL_COMMUNITY): Payer: Medicaid Other | Admitting: Anesthesiology

## 2015-10-03 ENCOUNTER — Inpatient Hospital Stay (HOSPITAL_COMMUNITY): Admission: RE | Admit: 2015-10-03 | Payer: Medicaid Other | Source: Ambulatory Visit

## 2015-10-03 ENCOUNTER — Inpatient Hospital Stay (HOSPITAL_COMMUNITY)
Admission: AD | Admit: 2015-10-03 | Discharge: 2015-10-06 | DRG: 765 | Disposition: A | Discharge status: Home / Self Care | Payer: Medicaid Other | Source: Ambulatory Visit | Attending: Obstetrics & Gynecology | Admitting: Obstetrics & Gynecology

## 2015-10-03 ENCOUNTER — Encounter (HOSPITAL_COMMUNITY): Payer: Self-pay | Admitting: *Deleted

## 2015-10-03 ENCOUNTER — Encounter (HOSPITAL_COMMUNITY)
Admission: AD | Disposition: A | Discharge status: Home / Self Care | Payer: Self-pay | Source: Ambulatory Visit | Attending: Obstetrics & Gynecology

## 2015-10-03 DIAGNOSIS — K219 Gastro-esophageal reflux disease without esophagitis: Secondary | ICD-10-CM | POA: Diagnosis present

## 2015-10-03 DIAGNOSIS — O99344 Other mental disorders complicating childbirth: Secondary | ICD-10-CM | POA: Diagnosis present

## 2015-10-03 DIAGNOSIS — Z6841 Body Mass Index (BMI) 40.0 and over, adult: Secondary | ICD-10-CM | POA: Diagnosis not present

## 2015-10-03 DIAGNOSIS — O99824 Streptococcus B carrier state complicating childbirth: Secondary | ICD-10-CM | POA: Diagnosis present

## 2015-10-03 DIAGNOSIS — O9962 Diseases of the digestive system complicating childbirth: Secondary | ICD-10-CM | POA: Diagnosis present

## 2015-10-03 DIAGNOSIS — Z3A39 39 weeks gestation of pregnancy: Secondary | ICD-10-CM

## 2015-10-03 DIAGNOSIS — O26893 Other specified pregnancy related conditions, third trimester: Secondary | ICD-10-CM | POA: Diagnosis present

## 2015-10-03 DIAGNOSIS — O339 Maternal care for disproportion, unspecified: Secondary | ICD-10-CM | POA: Diagnosis present

## 2015-10-03 DIAGNOSIS — Z98891 History of uterine scar from previous surgery: Secondary | ICD-10-CM

## 2015-10-03 DIAGNOSIS — Z6791 Unspecified blood type, Rh negative: Secondary | ICD-10-CM

## 2015-10-03 DIAGNOSIS — F329 Major depressive disorder, single episode, unspecified: Secondary | ICD-10-CM | POA: Diagnosis not present

## 2015-10-03 DIAGNOSIS — Z3490 Encounter for supervision of normal pregnancy, unspecified, unspecified trimester: Secondary | ICD-10-CM

## 2015-10-03 DIAGNOSIS — O24419 Gestational diabetes mellitus in pregnancy, unspecified control: Secondary | ICD-10-CM | POA: Diagnosis present

## 2015-10-03 DIAGNOSIS — F32A Depression, unspecified: Secondary | ICD-10-CM | POA: Diagnosis not present

## 2015-10-03 DIAGNOSIS — O3663X0 Maternal care for excessive fetal growth, third trimester, not applicable or unspecified: Secondary | ICD-10-CM | POA: Diagnosis present

## 2015-10-03 DIAGNOSIS — O99214 Obesity complicating childbirth: Secondary | ICD-10-CM | POA: Diagnosis present

## 2015-10-03 DIAGNOSIS — F418 Other specified anxiety disorders: Secondary | ICD-10-CM | POA: Diagnosis present

## 2015-10-03 DIAGNOSIS — O24420 Gestational diabetes mellitus in childbirth, diet controlled: Secondary | ICD-10-CM | POA: Diagnosis present

## 2015-10-03 LAB — COMPREHENSIVE METABOLIC PANEL
ALK PHOS: 146 U/L — AB (ref 38–126)
ALT: 14 U/L (ref 14–54)
AST: 17 U/L (ref 15–41)
Albumin: 2.9 g/dL — ABNORMAL LOW (ref 3.5–5.0)
Anion gap: 11 (ref 5–15)
BILIRUBIN TOTAL: 0.5 mg/dL (ref 0.3–1.2)
BUN: 11 mg/dL (ref 6–20)
CALCIUM: 9.2 mg/dL (ref 8.9–10.3)
CHLORIDE: 107 mmol/L (ref 101–111)
CO2: 19 mmol/L — ABNORMAL LOW (ref 22–32)
CREATININE: 0.59 mg/dL (ref 0.44–1.00)
Glucose, Bld: 84 mg/dL (ref 65–99)
Potassium: 4.1 mmol/L (ref 3.5–5.1)
Sodium: 137 mmol/L (ref 135–145)
Total Protein: 6.7 g/dL (ref 6.5–8.1)

## 2015-10-03 LAB — CBC
HCT: 35.9 % — ABNORMAL LOW (ref 36.0–46.0)
HEMOGLOBIN: 12.1 g/dL (ref 12.0–15.0)
MCH: 29.4 pg (ref 26.0–34.0)
MCHC: 33.7 g/dL (ref 30.0–36.0)
MCV: 87.3 fL (ref 78.0–100.0)
Platelets: 215 10*3/uL (ref 150–400)
RBC: 4.11 MIL/uL (ref 3.87–5.11)
RDW: 14 % (ref 11.5–15.5)
WBC: 11.7 10*3/uL — ABNORMAL HIGH (ref 4.0–10.5)

## 2015-10-03 LAB — LACTATE DEHYDROGENASE: LDH: 152 U/L (ref 98–192)

## 2015-10-03 LAB — GLUCOSE, CAPILLARY
Glucose-Capillary: 78 mg/dL (ref 65–99)
Glucose-Capillary: 87 mg/dL (ref 65–99)

## 2015-10-03 LAB — RPR: RPR Ser Ql: NONREACTIVE

## 2015-10-03 LAB — PREPARE RBC (CROSSMATCH)

## 2015-10-03 LAB — URIC ACID: URIC ACID, SERUM: 6.8 mg/dL — AB (ref 2.3–6.6)

## 2015-10-03 SURGERY — Surgical Case
Anesthesia: Spinal

## 2015-10-03 MED ORDER — KETOROLAC TROMETHAMINE 30 MG/ML IJ SOLN: 30.0000 mg | Freq: Once | INTRAMUSCULAR | Status: DC | PRN

## 2015-10-03 MED ORDER — KETOROLAC TROMETHAMINE 30 MG/ML IJ SOLN
30.0000 mg | Freq: Four times a day (QID) | INTRAMUSCULAR | Status: AC | PRN
Start: 2015-10-03 — End: 2015-10-04
  Administered 2015-10-03: 30 mg via INTRAMUSCULAR

## 2015-10-03 MED ORDER — MEASLES, MUMPS & RUBELLA VAC ~~LOC~~ INJ
0.5000 mL | INJECTION | Freq: Once | SUBCUTANEOUS | Status: AC
  Administered 2015-10-06: 0.5 mL via SUBCUTANEOUS
  Filled 2015-10-03: qty 0.5

## 2015-10-03 MED ORDER — MENTHOL 3 MG MT LOZG: 1.0000 | LOZENGE | OROMUCOSAL | Status: DC | PRN

## 2015-10-03 MED ORDER — BUPIVACAINE IN DEXTROSE 0.75-8.25 % IT SOLN
INTRATHECAL | Status: DC | PRN
  Administered 2015-10-03: 1.4 mL via INTRATHECAL

## 2015-10-03 MED ORDER — PANTOPRAZOLE SODIUM 20 MG PO TBEC
20.0000 mg | DELAYED_RELEASE_TABLET | Freq: Two times a day (BID) | ORAL | Status: DC
  Administered 2015-10-03 – 2015-10-06 (×7): 20 mg via ORAL
  Filled 2015-10-03 (×9): qty 1

## 2015-10-03 MED ORDER — KETOROLAC TROMETHAMINE 30 MG/ML IJ SOLN
INTRAMUSCULAR | Status: AC
  Administered 2015-10-03: 30 mg via INTRAMUSCULAR
  Filled 2015-10-03: qty 1

## 2015-10-03 MED ORDER — CEFAZOLIN SODIUM-DEXTROSE 2-3 GM-% IV SOLR
INTRAVENOUS | Status: AC
  Filled 2015-10-03: qty 50

## 2015-10-03 MED ORDER — PHENYLEPHRINE 8 MG IN D5W 100 ML (0.08MG/ML) PREMIX OPTIME
INJECTION | INTRAVENOUS | Status: DC | PRN
  Administered 2015-10-03: 60 ug/min via INTRAVENOUS

## 2015-10-03 MED ORDER — NALOXONE HCL 2 MG/2ML IJ SOSY
1.0000 ug/kg/h | PREFILLED_SYRINGE | INTRAVENOUS | Status: DC | PRN
  Filled 2015-10-03: qty 2

## 2015-10-03 MED ORDER — LACTATED RINGERS IV SOLN
INTRAVENOUS | Status: DC | PRN
  Administered 2015-10-03 (×5): via INTRAVENOUS

## 2015-10-03 MED ORDER — NALOXONE HCL 0.4 MG/ML IJ SOLN: 0.4000 mg | INTRAMUSCULAR | Status: DC | PRN

## 2015-10-03 MED ORDER — SODIUM CHLORIDE 0.9 % IR SOLN
Status: DC | PRN
  Administered 2015-10-03: 1000 mL

## 2015-10-03 MED ORDER — LANOLIN HYDROUS EX OINT: 1.0000 "application " | TOPICAL_OINTMENT | CUTANEOUS | Status: DC | PRN

## 2015-10-03 MED ORDER — OXYCODONE-ACETAMINOPHEN 5-325 MG PO TABS
1.0000 | ORAL_TABLET | ORAL | Status: DC | PRN
  Administered 2015-10-03 – 2015-10-05 (×4): 1 via ORAL
  Filled 2015-10-03 (×4): qty 1

## 2015-10-03 MED ORDER — MORPHINE SULFATE (PF) 0.5 MG/ML IJ SOLN
INTRAMUSCULAR | Status: DC | PRN
  Administered 2015-10-03: .2 mg via EPIDURAL

## 2015-10-03 MED ORDER — MORPHINE SULFATE (PF) 0.5 MG/ML IJ SOLN
INTRAMUSCULAR | Status: AC
  Filled 2015-10-03: qty 100

## 2015-10-03 MED ORDER — ONDANSETRON HCL 4 MG/2ML IJ SOLN: 4.0000 mg | Freq: Four times a day (QID) | INTRAMUSCULAR | Status: DC | PRN

## 2015-10-03 MED ORDER — PRENATAL MULTIVITAMIN CH
1.0000 | ORAL_TABLET | Freq: Every day | ORAL | Status: DC
  Administered 2015-10-04 – 2015-10-06 (×3): 1 via ORAL
  Filled 2015-10-03 (×3): qty 1

## 2015-10-03 MED ORDER — SENNOSIDES-DOCUSATE SODIUM 8.6-50 MG PO TABS
2.0000 | ORAL_TABLET | ORAL | Status: DC
  Administered 2015-10-04 – 2015-10-06 (×3): 2 via ORAL
  Filled 2015-10-03 (×3): qty 2

## 2015-10-03 MED ORDER — OXYCODONE-ACETAMINOPHEN 5-325 MG PO TABS
2.0000 | ORAL_TABLET | ORAL | Status: DC | PRN
  Administered 2015-10-04 – 2015-10-06 (×8): 2 via ORAL
  Filled 2015-10-03 (×9): qty 2

## 2015-10-03 MED ORDER — LACTATED RINGERS IV SOLN
Freq: Once | INTRAVENOUS | Status: AC
  Administered 2015-10-03: 07:00:00 via INTRAVENOUS

## 2015-10-03 MED ORDER — PHENYLEPHRINE 8 MG IN D5W 100 ML (0.08MG/ML) PREMIX OPTIME
INJECTION | INTRAVENOUS | Status: AC
  Filled 2015-10-03: qty 100

## 2015-10-03 MED ORDER — SCOPOLAMINE 1 MG/3DAYS TD PT72
1.0000 | MEDICATED_PATCH | Freq: Once | TRANSDERMAL | Status: DC
  Administered 2015-10-03: 1.5 mg via TRANSDERMAL

## 2015-10-03 MED ORDER — MEPERIDINE HCL 25 MG/ML IJ SOLN: 6.2500 mg | INTRAMUSCULAR | Status: DC | PRN

## 2015-10-03 MED ORDER — HYDROMORPHONE HCL 1 MG/ML IJ SOLN: 0.2500 mg | INTRAMUSCULAR | Status: DC | PRN

## 2015-10-03 MED ORDER — SCOPOLAMINE 1 MG/3DAYS TD PT72
1.0000 | MEDICATED_PATCH | Freq: Once | TRANSDERMAL | Status: DC
  Filled 2015-10-03: qty 1

## 2015-10-03 MED ORDER — FENTANYL CITRATE (PF) 100 MCG/2ML IJ SOLN
INTRAMUSCULAR | Status: DC | PRN
  Administered 2015-10-03: 20 ug via INTRAVENOUS

## 2015-10-03 MED ORDER — ONDANSETRON HCL 4 MG/2ML IJ SOLN: 4.0000 mg | Freq: Three times a day (TID) | INTRAMUSCULAR | Status: DC | PRN

## 2015-10-03 MED ORDER — LACTATED RINGERS IV SOLN
INTRAVENOUS | Status: DC
  Administered 2015-10-03 (×3): via INTRAVENOUS

## 2015-10-03 MED ORDER — PROMETHAZINE HCL 25 MG/ML IJ SOLN: 6.2500 mg | INTRAMUSCULAR | Status: DC | PRN

## 2015-10-03 MED ORDER — OXYTOCIN 40 UNITS IN LACTATED RINGERS INFUSION - SIMPLE MED: 62.5000 mL/h | INTRAVENOUS | Status: AC

## 2015-10-03 MED ORDER — WITCH HAZEL-GLYCERIN EX PADS
1.0000 | MEDICATED_PAD | CUTANEOUS | Status: DC | PRN
Start: 2015-10-03 — End: 2015-10-06

## 2015-10-03 MED ORDER — NALBUPHINE HCL 10 MG/ML IJ SOLN: 5.0000 mg | Freq: Once | INTRAMUSCULAR | Status: DC | PRN

## 2015-10-03 MED ORDER — DIPHENHYDRAMINE HCL 25 MG PO CAPS: 25.0000 mg | ORAL_CAPSULE | Freq: Four times a day (QID) | ORAL | Status: DC | PRN

## 2015-10-03 MED ORDER — SODIUM CHLORIDE 0.9 % IJ SOLN: 3.0000 mL | INTRAMUSCULAR | Status: DC | PRN

## 2015-10-03 MED ORDER — DIPHENHYDRAMINE HCL 25 MG PO CAPS: 25.0000 mg | ORAL_CAPSULE | ORAL | Status: DC | PRN

## 2015-10-03 MED ORDER — IBUPROFEN 600 MG PO TABS
600.0000 mg | ORAL_TABLET | Freq: Four times a day (QID) | ORAL | Status: DC | PRN
  Administered 2015-10-04 (×2): 600 mg via ORAL

## 2015-10-03 MED ORDER — NALBUPHINE HCL 10 MG/ML IJ SOLN: 5.0000 mg | INTRAMUSCULAR | Status: DC | PRN

## 2015-10-03 MED ORDER — SIMETHICONE 80 MG PO CHEW
80.0000 mg | CHEWABLE_TABLET | ORAL | Status: DC
  Administered 2015-10-04 – 2015-10-06 (×3): 80 mg via ORAL
  Filled 2015-10-03 (×3): qty 1

## 2015-10-03 MED ORDER — LACTATED RINGERS IV SOLN
INTRAVENOUS | Status: DC
  Administered 2015-10-03: 18:00:00 via INTRAVENOUS

## 2015-10-03 MED ORDER — DIPHENHYDRAMINE HCL 50 MG/ML IJ SOLN: 12.5000 mg | INTRAMUSCULAR | Status: DC | PRN

## 2015-10-03 MED ORDER — ACETAMINOPHEN 325 MG PO TABS: 650.0000 mg | ORAL_TABLET | ORAL | Status: DC | PRN

## 2015-10-03 MED ORDER — OXYTOCIN 40 UNITS IN LACTATED RINGERS INFUSION - SIMPLE MED
INTRAVENOUS | Status: DC | PRN
  Administered 2015-10-03: 40 [IU] via INTRAVENOUS

## 2015-10-03 MED ORDER — KETOROLAC TROMETHAMINE 30 MG/ML IJ SOLN: 30.0000 mg | Freq: Four times a day (QID) | INTRAMUSCULAR | Status: AC | PRN

## 2015-10-03 MED ORDER — ONDANSETRON HCL 4 MG/2ML IJ SOLN
INTRAMUSCULAR | Status: DC | PRN
  Administered 2015-10-03: 4 mg via INTRAVENOUS

## 2015-10-03 MED ORDER — SIMETHICONE 80 MG PO CHEW: 80.0000 mg | CHEWABLE_TABLET | ORAL | Status: DC | PRN

## 2015-10-03 MED ORDER — SIMETHICONE 80 MG PO CHEW
80.0000 mg | CHEWABLE_TABLET | Freq: Three times a day (TID) | ORAL | Status: DC
  Administered 2015-10-03 – 2015-10-06 (×10): 80 mg via ORAL
  Filled 2015-10-03 (×10): qty 1

## 2015-10-03 MED ORDER — IBUPROFEN 600 MG PO TABS
600.0000 mg | ORAL_TABLET | Freq: Four times a day (QID) | ORAL | Status: DC
  Administered 2015-10-03 – 2015-10-06 (×10): 600 mg via ORAL
  Filled 2015-10-03 (×12): qty 1

## 2015-10-03 MED ORDER — FENTANYL CITRATE (PF) 100 MCG/2ML IJ SOLN
INTRAMUSCULAR | Status: AC
  Filled 2015-10-03: qty 4

## 2015-10-03 MED ORDER — OXYTOCIN 10 UNIT/ML IJ SOLN
INTRAMUSCULAR | Status: AC
  Filled 2015-10-03: qty 4

## 2015-10-03 MED ORDER — ZOLPIDEM TARTRATE 5 MG PO TABS: 5.0000 mg | ORAL_TABLET | Freq: Every evening | ORAL | Status: DC | PRN

## 2015-10-03 MED ORDER — LACTATED RINGERS IV SOLN: INTRAVENOUS | Status: DC

## 2015-10-03 MED ORDER — ACETAMINOPHEN 500 MG PO TABS
1000.0000 mg | ORAL_TABLET | Freq: Four times a day (QID) | ORAL | Status: AC
  Administered 2015-10-03 (×2): 1000 mg via ORAL
  Filled 2015-10-03 (×2): qty 2

## 2015-10-03 MED ORDER — CEFAZOLIN SODIUM-DEXTROSE 2-3 GM-% IV SOLR
INTRAVENOUS | Status: AC
Start: 2015-10-03 — End: 2015-10-03
  Filled 2015-10-03: qty 50

## 2015-10-03 MED ORDER — ONDANSETRON HCL 4 MG/2ML IJ SOLN
INTRAMUSCULAR | Status: AC
  Filled 2015-10-03: qty 2

## 2015-10-03 MED ORDER — SCOPOLAMINE 1 MG/3DAYS TD PT72
MEDICATED_PATCH | TRANSDERMAL | Status: AC
  Filled 2015-10-03: qty 1

## 2015-10-03 MED ORDER — DIBUCAINE 1 % RE OINT: 1.0000 "application " | TOPICAL_OINTMENT | RECTAL | Status: DC | PRN

## 2015-10-03 SURGICAL SUPPLY — 40 items
APL SKNCLS STERI-STRIP NONHPOA (GAUZE/BANDAGES/DRESSINGS) ×1
BENZOIN TINCTURE PRP APPL 2/3 (GAUZE/BANDAGES/DRESSINGS) ×3 IMPLANT
CLAMP CORD UMBIL (MISCELLANEOUS) IMPLANT
CLOSURE WOUND 1/2 X4 (GAUZE/BANDAGES/DRESSINGS) ×1
CLOTH BEACON ORANGE TIMEOUT ST (SAFETY) ×3 IMPLANT
DRAPE SHEET LG 3/4 BI-LAMINATE (DRAPES) IMPLANT
DRSG OPSITE POSTOP 4X10 (GAUZE/BANDAGES/DRESSINGS) ×6 IMPLANT
DRSG OPSITE POSTOP 4X12 (GAUZE/BANDAGES/DRESSINGS) ×3 IMPLANT
DURAPREP 26ML APPLICATOR (WOUND CARE) ×3 IMPLANT
ELECT REM PT RETURN 9FT ADLT (ELECTROSURGICAL) ×3
ELECTRODE REM PT RTRN 9FT ADLT (ELECTROSURGICAL) ×1 IMPLANT
EXTRACTOR VACUUM KIWI (MISCELLANEOUS) IMPLANT
GLOVE BIOGEL PI IND STRL 6.5 (GLOVE) ×1 IMPLANT
GLOVE BIOGEL PI INDICATOR 6.5 (GLOVE) ×2
GLOVE ECLIPSE 6.5 STRL STRAW (GLOVE) ×3 IMPLANT
GOWN STRL REUS W/TWL LRG LVL3 (GOWN DISPOSABLE) ×6 IMPLANT
KIT ABG SYR 3ML LUER SLIP (SYRINGE) IMPLANT
LIQUID BAND (GAUZE/BANDAGES/DRESSINGS) IMPLANT
NEEDLE HYPO 25X5/8 SAFETYGLIDE (NEEDLE) IMPLANT
NS IRRIG 1000ML POUR BTL (IV SOLUTION) ×3 IMPLANT
PACK C SECTION WH (CUSTOM PROCEDURE TRAY) ×3 IMPLANT
PAD ABD 7.5X8 STRL (GAUZE/BANDAGES/DRESSINGS) ×3 IMPLANT
PAD OB MATERNITY 4.3X12.25 (PERSONAL CARE ITEMS) ×3 IMPLANT
PENCIL SMOKE EVAC W/HOLSTER (ELECTROSURGICAL) ×3 IMPLANT
RETRACTOR TRAXI PANNICULUS (MISCELLANEOUS) ×1 IMPLANT
RTRCTR C-SECT PINK 25CM LRG (MISCELLANEOUS) ×3 IMPLANT
SPONGE GAUZE 4X4 12PLY STER LF (GAUZE/BANDAGES/DRESSINGS) ×6 IMPLANT
STRIP CLOSURE SKIN 1/2X4 (GAUZE/BANDAGES/DRESSINGS) ×2 IMPLANT
SUT PLAIN 0 NONE (SUTURE) IMPLANT
SUT PLAIN 2 0 XLH (SUTURE) ×3 IMPLANT
SUT VIC AB 0 CT1 27 (SUTURE) ×4
SUT VIC AB 0 CT1 27XBRD ANBCTR (SUTURE) ×2 IMPLANT
SUT VIC AB 0 CTX 36 (SUTURE) ×9
SUT VIC AB 0 CTX36XBRD ANBCTRL (SUTURE) ×3 IMPLANT
SUT VIC AB 2-0 CT1 27 (SUTURE) ×3
SUT VIC AB 2-0 CT1 TAPERPNT 27 (SUTURE) ×1 IMPLANT
SUT VIC AB 4-0 KS 27 (SUTURE) ×3 IMPLANT
TOWEL OR 17X24 6PK STRL BLUE (TOWEL DISPOSABLE) ×3 IMPLANT
TRAXI PANNICULUS RETRACTOR (MISCELLANEOUS) ×2
TRAY FOLEY CATH SILVER 14FR (SET/KITS/TRAYS/PACK) IMPLANT

## 2015-10-03 NOTE — Transfer of Care (Signed)
Immediate Anesthesia Transfer of Care Note  Patient: Kim Davidson  Procedure(s) Performed: Procedure(s) with comments: CESAREAN SECTION (N/A) - EDD 10/08/15 RNFA confirmed on 10/01/15 [name]  Patient Location: PACU  Anesthesia Type:Spinal  Level of Consciousness: awake, alert , oriented and patient cooperative  Airway & Oxygen Therapy: Patient Spontanous Breathing  Post-op Assessment: Report given to RN and Post -op Vital signs reviewed and stable  Post vital signs: Reviewed and stable  Last Vitals:  TEMP 98.8 BP 117/46 HR 70 RR  22 POX 637  Complications: No apparent anesthesia complications

## 2015-10-03 NOTE — Lactation Note (Signed)
This note was copied from the chart of Kim Tauni Sanks. Lactation Consultation Note  Patient Name: Kim Davidson [id number] Date: 10/03/2015 Reason for consult: Initial assessment (per mom recently attempted to latch at 34 p, baby sleepy , baby skin to skin with mom )  Baby is 56 hours old and has been to the breast x2 for latches. 15 mins , and several attempts. Voided x 1 and HNS .  LC reviewed basics , importance of skin to skin, ( which mom is presently doing ) , early feeding behaviors, and that baby's are given 24 hours to  To void and stool and pick up feedings.  LC recommended to call when baby is showing feeding cues. Mother informed of post-discharge support and given phone number to the lactation department, including services for phone call assistance; out-patient appointments; and breastfeeding support group. List of other breastfeeding resources in the community given in the handout. Encouraged mother to call for problems or concerns related to breastfeeding.   Maternal Data Does the patient have breastfeeding experience prior to this delivery?: No  Feeding Feeding Type: Breast Fed Length of feed: 0 min  LATCH Score/Interventions Latch: Too sleepy or reluctant, no latch achieved, no sucking elicited. Intervention(s): Skin to skin;Waking techniques;Teach feeding cues Intervention(s): Adjust position;Assist with latch;Breast compression  Audible Swallowing: None Intervention(s): Skin to skin;Hand expression Intervention(s): Skin to skin;Hand expression  Type of Nipple: Flat Intervention(s): No intervention needed  Comfort (Breast/Nipple): Soft / non-tender     Hold (Positioning): Assistance needed to correctly position infant at breast and maintain latch. Intervention(s): Breastfeeding basics reviewed  LATCH Score: 4  Lactation Tools Discussed/Used WIC Program: Yes (per mom [place] )   Consult Status Consult Status: Follow-up  (encouraged to page with feeding cues ) Date: 10/03/15 Follow-up type: In-patient    Myer Haff 10/03/2015, 5:29 PM

## 2015-10-03 NOTE — Anesthesia Procedure Notes (Signed)
Spinal Patient location during procedure: OR Start time: 10/03/2015 7:55 AM End time: 10/03/2015 7:57 AM Staffing Anesthesiologist: Lyn Hollingshead Performed by: anesthesiologist  Preanesthetic Checklist Completed: patient identified, surgical consent, pre-op evaluation, timeout performed, IV checked, risks and benefits discussed and monitors and equipment checked Spinal Block Patient position: sitting Prep: site prepped and draped and DuraPrep Patient monitoring: heart rate, cardiac monitor, continuous pulse ox and blood pressure Approach: midline Location: L3-4 Injection technique: single-shot Needle Needle type: Pencan  Needle gauge: 24 G Needle length: 9 cm Needle insertion depth: 9 cm Assessment Sensory level: T4

## 2015-10-03 NOTE — Anesthesia Postprocedure Evaluation (Signed)
Anesthesia Post Note  Patient: Kim Davidson  Procedure(s) Performed: Procedure(s) (LRB): CESAREAN SECTION (N/A)  Anesthesia type: Spinal  Patient location: PACU  Post pain: Pain level controlled  Post assessment: Post-op Vital signs reviewed  Last Vitals:  Filed Vitals:   10/03/15 1000  BP: 80/50  Pulse: 77  Temp:   Resp: 17    Post vital signs: Reviewed  Level of consciousness: awake  Complications: No apparent anesthesia complications

## 2015-10-03 NOTE — Progress Notes (Deleted)
PreOp Diagnosis: 1) Intrauterine pregnancy @ [redacted]w[redacted]d 2) Suspected LGA and CPD 3) Gestational DM 4) Maternal obesity PostOp Diagnosis: same Procedure: Primary C-section Surgeon: Dr. [name] Assistant: Charlestine Massed, RNFA Anesthesia: spinal Complications: none EBL: 700cc UOP: 200cc Fluids: 3300  Findings: Female infant from vertex presentation, APGARS 9,9, Weight: 9#2oz, Normal uterus, tubes and ovaries bilaterally  PROCEDURE:  Informed consent was obtained from the patient with risks, benefits, complications, treatment options, and expected outcomes discussed with the patient.  The patient concurred with the proposed plan, giving informed consent with form signed.   The patient was taken to Operating Room, and identified with the procedure verified as C-Section Delivery with Time Out. A Traxi was applied to assist in retraction.  With induction of anesthesia, the patient was prepped and draped in the usual sterile fashion. A Pfannenstiel incision was made and carried down through the subcutaneous tissue to the fascia. The fascia was incised in the midline and extended transversely. The superior aspect of the fascial incision was grasped with Kochers elevated and the underlying muscle dissected off. The inferior aspect of the facial incision was in similar fashion, grasped elevated and rectus muscles dissected off. The peritoneum was identified and entered. Peritoneal incision was extended longitudinally. The Alexis retractor was placed. The utero-vesical peritoneal reflection was identified and incised transversely with the [facility] scissors, the incision extended laterally, the bladder flap created digitally. A low transverse uterine incision was made and the infants head delivered atraumatically. After the umbilical cord was clamped and cut cord blood was obtained for evaluation.   The placenta was removed intact and appeared normal. The uterine outline, tubes and ovaries appeared normal. The  uterine incision was closed with running locked sutures of 0 Vicryl and a second layer of the same stitch was used in an imbricating fashion.  Excellent hemostasis was obtained.  The pericolic gutters were then cleared of all clots and debris. The Alexis retractor was removed.  The peritoneum was closed in a running fashion. The fascia was then reapproximated with running sutures of 0 Vicryl. The subcutaneous tissue was reapproximated with 2-0 plain gut suture.  The skin was closed with 4-0 vicryl in a subcuticular fashion.  Instrument, sponge, and needle counts were correct prior the abdominal closure and at the conclusion of the case. The patient was taken to recovery in stable condition.  [name], DO [phone] (pager) [phone] (office)

## 2015-10-03 NOTE — Interval H&P Note (Signed)
History and Physical Interval Note:  10/03/2015 6:54 AM  Kim Davidson  has presented today for surgery, with the diagnosis of O82 C-Section  The various methods of treatment have been discussed with the patient and family. After consideration of risks, benefits and other options for treatment, the patient has consented to  Procedure(s) with comments: CESAREAN SECTION (N/A) - EDD 10/08/15 RNFA confirmed on 10/01/15 [name] as a surgical intervention .  The patient's history has been reviewed, patient examined, no change in status, stable for surgery.  I have reviewed the patient's chart and labs.  Questions were answered to the patient's satisfaction.     [name], M

## 2015-10-03 NOTE — Progress Notes (Signed)
Pt orthostatic vitals were:  Lying: 114/47 HR 80  Sitting: 109/47 HR 97  Lying: 96/47 HR 124 Pt asymptomatic and walked to bathroom with no problems and minimal assistance.  Urine output 50 ml/hr.  Dr. Nelda Marseille notified and no new orders given at this time.  Will continue to monitor.

## 2015-10-04 ENCOUNTER — Encounter (HOSPITAL_COMMUNITY): Payer: Self-pay | Admitting: Obstetrics and Gynecology

## 2015-10-04 LAB — GLUCOSE, CAPILLARY: GLUCOSE-CAPILLARY: 67 mg/dL (ref 65–99)

## 2015-10-04 LAB — CBC
HCT: 28.9 % — ABNORMAL LOW (ref 36.0–46.0)
Hemoglobin: 9.8 g/dL — ABNORMAL LOW (ref 12.0–15.0)
MCH: 29.9 pg (ref 26.0–34.0)
MCHC: 33.9 g/dL (ref 30.0–36.0)
MCV: 88.1 fL (ref 78.0–100.0)
PLATELETS: 160 10*3/uL (ref 150–400)
RBC: 3.28 MIL/uL — AB (ref 3.87–5.11)
RDW: 14.2 % (ref 11.5–15.5)
WBC: 10.9 10*3/uL — AB (ref 4.0–10.5)

## 2015-10-04 LAB — BIRTH TISSUE RECOVERY COLLECTION (PLACENTA DONATION)

## 2015-10-04 MED ORDER — SERTRALINE HCL 50 MG PO TABS
50.0000 mg | ORAL_TABLET | Freq: Every day | ORAL | Status: DC
  Administered 2015-10-04 – 2015-10-06 (×3): 50 mg via ORAL
  Filled 2015-10-04 (×4): qty 1

## 2015-10-04 MED ORDER — RHO D IMMUNE GLOBULIN 1500 UNIT/2ML IJ SOSY
300.0000 ug | PREFILLED_SYRINGE | Freq: Once | INTRAMUSCULAR | Status: AC
  Administered 2015-10-04: 300 ug via INTRAVENOUS
  Filled 2015-10-04: qty 2

## 2015-10-04 MED ORDER — FERROUS SULFATE 325 (65 FE) MG PO TABS
325.0000 mg | ORAL_TABLET | Freq: Every day | ORAL | Status: DC
  Administered 2015-10-04 – 2015-10-06 (×3): 325 mg via ORAL
  Filled 2015-10-04 (×3): qty 1

## 2015-10-04 NOTE — Progress Notes (Signed)
Postoperative Note Day # 1  S:  Patient resting comfortable in bed.  Pain controlled.  Tolerating general. No flatus, no BM.  Lochia moderate.  Ambulating without difficulty.  She denies n/v/f/c, SOB, or CP.  Pt plans on breastfeeding.  Voiding freely  O: Temp:  [98.2 F (36.8 C)-99.6 F (37.6 C)] 98.7 F (37.1 C) (10/21 0221) Pulse Rate:  [69-93] 75 (10/21 0221) Resp:  [16-37] 18 (10/21 0221) BP: (80-128)/(46-90) 117/77 mmHg (10/21 0221) SpO2:  [95 %-100 %] 96 % (10/21 0221)   Gen: A&Ox3, NAD CV: RRR, no MRG Resp: CTAB Abdomen: obese, soft, NT, ND BS hypoactive Uterus: firm, non-tender, below umbilicus Incision: c/d/i, bandage on Ext: 2+ pitting edema, no calf tenderness bilaterally, SCDs at bedside  Labs:  Recent Labs  10/03/15 0705 10/04/15 0545  HGB 12.1 9.8*    A/P: Pt is a 24 y.o. G1P1001 s/p Primary C-section, POD#1  - Pain well controlled -GU: UOP is adequate -GI: Tolerating general diet -Activity: encouraged sitting up to chair and ambulation as tolerated -Prophylaxis: SCDs while in bed, early ambulation -Labs: Appropriate decline in Hgb, will start iron daily -GDMA1: accucheck this am -Depression/Anxiety: Restart zoloft 50mg  daily  DISPO; Continue routine postoperative care, plan for discharge home on POD#3.   [name], DO [phone] (pager) [phone] (office)

## 2015-10-04 NOTE — Lactation Note (Signed)
This note was copied from the chart of Kim Davidson. Lactation Consultation Note New mom having difficulty latching baby. Has inverted nipples at the end of cone shaped breast. Very wide space between breast, 3-4 fingers wide. Has PCOS. Hand expressed colostrum. Collected 5 ml in vial. Fitted mom w/#16NS, #20NS fit Rt. Nipple well. Need to roll base of nipple in finger tips to define boarders of nipples to apply NS. Areolas has has a small bruise to Lt areola. Finally got baby to latch onto NS w/colostrum in NS. Gave 78ml colostrum. BF well once she started suckling. Took a lot of stimulation. Mom shown how to use DEBP & how to disassemble, clean, & reassemble parts. Mom knows to pump q3h for 15-20 min. Mom has hand pump. Gave shells to wear in bra today.  Patient Name: Kim Deboraha Goar [id number] Date: 10/04/2015 Reason for consult: Follow-up assessment;Difficult latch   Maternal Data Has patient been taught Hand Expression?: Yes  Feeding Feeding Type: Breast Milk Length of feed: 15 min  LATCH Score/Interventions Latch: Repeated attempts needed to sustain latch, nipple held in mouth throughout feeding, stimulation needed to elicit sucking reflex. Intervention(s): Skin to skin;Teach feeding cues;Waking techniques Intervention(s): Adjust position;Assist with latch;Breast massage;Breast compression  Audible Swallowing: Spontaneous and intermittent Intervention(s): Skin to skin;Hand expression Intervention(s): Hand expression;Alternate breast massage  Type of Nipple: Inverted Intervention(s): Shells;Hand pump;Double electric pump;Reverse pressure  Comfort (Breast/Nipple): Filling, red/small blisters or bruises, mild/mod discomfort (bruises to areolas)  Interventions (Mild/moderate discomfort): Hand massage;Hand expression;Reverse pressue;Pre-pump if needed;Post-pump;Breast shields  Hold (Positioning): Assistance needed to correctly position infant at breast and maintain  latch. Intervention(s): Breastfeeding basics reviewed;Support Pillows;Position options;Skin to skin  LATCH Score: 5  Lactation Tools Discussed/Used Tools: Shells;Nipple Jefferson Fuel;Pump Nipple shield size: 16;20 Shell Type: Inverted Breast pump type: Double-Electric Breast Pump Pump Review: Setup, frequency, and cleaning;Milk Storage Initiated by:: Allayne Stack RN Date initiated:: 10/04/15   Consult Status Consult Status: Follow-up Date: 10/05/15 Follow-up type: In-patient    Caleb Decock, Elta Gu[place] 10/04/2015, 4:12 AM

## 2015-10-04 NOTE — Progress Notes (Signed)
MOB was referred for history of depression/anxiety.  Referral is screened out by Clinical Social Worker because none of the following criteria appear to apply: -History of anxiety/depression during this pregnancy, or of post-partum depression. - Diagnosis of anxiety and/or depression within last 3 years - History of depression due to pregnancy loss/loss of child or -MOB's symptoms are currently being treated with medication and/or therapy. MOB re-started on Zoloft postpartum.   Please contact the Clinical Social Worker if needs arise or upon MOB request.

## 2015-10-04 NOTE — Lactation Note (Signed)
This note was copied from the chart of Kim Davidson. Lactation Consultation Note  Patient Name: Kim Davidson [id number] Date: 10/04/2015 Reason for consult: Follow-up assessment   Follow-up at 63 hours old; GA 39.2; BW 9 lbs, 2.6 oz; 2% weight loss.  Mom Hx PCOS.   Breast assessment revealed some spacing between breast tissue, but palpated glandular tissue present with full breast.  Mom reports being able to easily hand express colostrum and reports seeing colostrum in shield when infant comes off breast.  Infant has breastfed x6 (10-15 min) + attempts x4 (0-5 min) + EBM 5 ml x1; voids-1; stools-2 in past 24 hours.   RN reports infant was sleepy yesterday and spitty.   Mom had infant on breast in football hold on left side (second side) when LC entered room with a #24 nipple shield.  Mom stated her mother helped get the baby latched.   LC adjusted baby's bottom further back; taught mom how to keep her on deep.  Baby was at end of feeding and LC noted nipple shield popping in&out of infant's mouth as infant was feeding.  [place] educated on importance of retaining depth for milk transfer.  LS-7.   Colostrum noted in shield when infant came off breast.  Infant is able to pull nipple into shield.  Using #24 shield (#20 nipple shield was lost so mom used the one she brought from home.  Stated the #24 felt better.  LC noted appropriate fit for mom's breast and nipple tissue).   RN came into room while LC was there and stated the feedings were improving today.  Mom stated she felt much better about the feeding today. Encouraged mom to call RN for assistance if needed.     Feeding Feeding Type: Breast Fed  LATCH Score/Interventions Latch: Repeated attempts needed to sustain latch, nipple held in mouth throughout feeding, stimulation needed to elicit sucking reflex.  Audible Swallowing: A few with stimulation  Type of Nipple: Flat (everts inside nipple shield)  Comfort (Breast/Nipple):  Soft / non-tender     Hold (Positioning): No assistance needed to correctly position infant at breast. Intervention(s): Support Pillows;Skin to skin;Breastfeeding basics reviewed  LATCH Score: 7  Lactation Tools Discussed/Used Tools: Nipple Shields Nipple shield size: 24 (mom lost #20 and had brought a #24 with her to hospital; fits appropriately)   Consult Status Consult Status: Follow-up Date: 10/05/15 Follow-up type: In-patient    Merlene Laughter 10/04/2015, 1:08 PM

## 2015-10-05 DIAGNOSIS — Z98891 History of uterine scar from previous surgery: Secondary | ICD-10-CM

## 2015-10-05 DIAGNOSIS — F32A Depression, unspecified: Secondary | ICD-10-CM | POA: Diagnosis not present

## 2015-10-05 DIAGNOSIS — F329 Major depressive disorder, single episode, unspecified: Secondary | ICD-10-CM | POA: Diagnosis not present

## 2015-10-05 DIAGNOSIS — O24419 Gestational diabetes mellitus in pregnancy, unspecified control: Secondary | ICD-10-CM | POA: Diagnosis present

## 2015-10-05 LAB — RH IG WORKUP (INCLUDES ABO/RH)
ABO/RH(D): O NEG
FETAL SCREEN: NEGATIVE
GESTATIONAL AGE(WKS): 39.2
Unit division: 0

## 2015-10-05 NOTE — Lactation Note (Signed)
This note was copied from the chart of Kim Enya Bureau. Lactation Consultation Note  Patient Name: Kim Davidson [id number] Date: 10/05/2015 Reason for consult: Follow-up assessment;Difficult latch   Mom finished pumping. Obtained 2 cc EBM. Infant awake and cueing to feed. Mom able to position in [place] to right breast and latch infant independently.. Mom applied # 24 NS independently. 2cc EBM put in NS using curved tip syringe. Infant latched easily with longer deeper sucks, mom noted difference. Enc mom to pump 6-8 times post feed and use EBM for infant. Enc mom to call as needed for assistance. Mom voiced understanding.    Maternal Data Formula Feeding for Exclusion: No Does the patient have breastfeeding experience prior to this delivery?: No  Feeding Feeding Type: Breast Fed Length of feed: 5 min  LATCH Score/Interventions Latch: Repeated attempts needed to sustain latch, nipple held in mouth throughout feeding, stimulation needed to elicit sucking reflex. Intervention(s): Skin to skin;Teach feeding cues;Waking techniques Intervention(s): Adjust position;Assist with latch;Breast massage;Breast compression  Audible Swallowing: A few with stimulation Intervention(s): Skin to skin Intervention(s): Skin to skin  Type of Nipple: Flat Intervention(s): Double electric pump  Comfort (Breast/Nipple): Filling, red/small blisters or bruises, mild/mod discomfort (Positional Stripes to both nipples)  Problem noted: Cracked, bleeding, blisters, bruises Interventions  (Cracked/bleeding/bruising/blister): Expressed breast milk to nipple Interventions (Mild/moderate discomfort): Breast shields  Hold (Positioning): No assistance needed to correctly position infant at breast. Intervention(s): Breastfeeding basics reviewed;Support Pillows;Position options;Skin to skin  LATCH Score: 6  Lactation Tools Discussed/Used Tools: Nipple Jefferson Fuel;Medicine Dropper Nipple shield  size: 24 Breast pump type: Double-Electric Breast Pump Pump Review: Setup, frequency, and cleaning   Consult Status Consult Status: Follow-up Date: 10/06/15 Follow-up type: In-patient    Kim Davidson 10/05/2015, 5:35 PM

## 2015-10-05 NOTE — Progress Notes (Signed)
Subjective: Postop Day 2: Cesarean Delivery No complaints.  Pain controlled.  Lochia normal.  Breast feeding yes. Pt is having some difficulty with nursing because the baby keeps falling asleep at the breast.  Ambulated in halls TID yesterday.  Objective: Temp:  [98.2 F (36.8 C)-99 F (37.2 C)] 98.2 F (36.8 C) (10/22 0712) Pulse Rate:  [78-80] 78 (10/22 0712) Resp:  [18-19] 18 (10/22 0712) BP: (114-130)/(59-65) 114/65 mmHg (10/22 3151)  Physical Exam: Gen: NAD Lochia: Not visualized Uterine Fundus: firm, appropriately tender Incision: Honeycomb dressing minimally soiled. DVT Evaluation: 3+ Edema present, no calf tenderness bilaterally    Recent Labs  10/03/15 0705 10/04/15 0545  HGB 12.1 9.8*  HCT 35.9* 28.9*    Assessment/Plan: Status post C-section-doing well postoperatively. Encouraged continued ambulation in halls TID. Lactation support. Anticipate discharge in am.   Kim Davidson 10/05/2015, 12:03 PM

## 2015-10-05 NOTE — Lactation Note (Signed)
This note was copied from the chart of Kim Davidson. Lactation Consultation Note  Patient Name: Kim Davidson [id number] Date: 10/05/2015 Reason for consult: Follow-up assessment;Difficult latch   Follow up consult with mom and Kim Davidson. Mom with history of PCOS and GDM. Baby is 75 hours old with 9 BF for 10-40 minutes, and 1 cluster feed on and off for 120 minutes, 2 voids and 4 stools. Infant with 6% weight loss since birth. Mom has been using a # 20 and # 24 NS with all feedings. Mom reports she feels infant is feeding much better today and does better is she self awakes vs awakening her. Mom reports she has not been pumping today, encouraged her to begin pumping post 6-8 feeds due to NS use and any and all EBM is to be given to baby via syringe or cup. Infant is awake and alert. Mom with cone shaped wide spaced breasts with inverted nipples at rest that do become slightly erect with stimulation. Breasts are soft and compressible. Assisted mom with latching infant STS to left breast using # 24 NS. Infant latched briefly x 2 and then fell asleep. Infant was able to pull nipple slightly into NS. Used awakening techniques to assist in keeping infant awake. We were able to hand express gtts of colostrum. Infant was repositioned to cross cradle to left breast with NS, again she sucked 10-15 sucks and went to sleep, would not awaken to feed. Set mom up with DEBP, will return to assist mom in supplementing any EBM obtained. Enc mom to continue to feed 8-12 x in 24 hours at first feeding cues. Enc mom to call RN as needed for assistance with feeding. Mom voiced understanding to plan.    Maternal Data Formula Feeding for Exclusion: No  Feeding Feeding Type: Breast Fed Length of feed: 2 min  LATCH Score/Interventions Latch: Repeated attempts needed to sustain latch, nipple held in mouth throughout feeding, stimulation needed to elicit sucking reflex. Intervention(s): Skin to skin;Teach  feeding cues;Waking techniques Intervention(s): Adjust position;Assist with latch;Breast compression  Audible Swallowing: A few with stimulation Intervention(s): Skin to skin  Type of Nipple: Flat (Become more erect with stimulation)  Comfort (Breast/Nipple): Filling, red/small blisters or bruises, mild/mod discomfort  Problem noted: Cracked, bleeding, blisters, bruises Interventions  (Cracked/bleeding/bruising/blister): Expressed breast milk to nipple;Double electric pump Interventions (Mild/moderate discomfort): Breast shields  Hold (Positioning): Assistance needed to correctly position infant at breast and maintain latch. Intervention(s): Breastfeeding basics reviewed;Support Pillows;Position options;Skin to skin  LATCH Score: 5  Lactation Tools Discussed/Used Tools: Nipple Kim Davidson;Pump Nipple shield size: 20;24 Breast pump type: Double-Electric Breast Pump Pump Review: Setup, frequency, and cleaning   Consult Status Consult Status: Follow-up Date: 10/06/15 Follow-up type: In-patient    Kim Davidson 10/05/2015, 4:52 PM

## 2015-10-06 ENCOUNTER — Encounter (HOSPITAL_COMMUNITY): Payer: Self-pay | Admitting: Obstetrics & Gynecology

## 2015-10-06 LAB — TYPE AND SCREEN
ABO/RH(D): O NEG
ANTIBODY SCREEN: NEGATIVE
UNIT DIVISION: 0
Unit division: 0
Unit division: 0

## 2015-10-06 MED ORDER — PRENATAL MULTIVITAMIN CH: 1.0000 | ORAL_TABLET | Freq: Every day | ORAL | Status: AC

## 2015-10-06 MED ORDER — OXYCODONE-ACETAMINOPHEN 5-325 MG PO TABS: 1.0000 | ORAL_TABLET | ORAL | Status: AC | PRN

## 2015-10-06 MED ORDER — IBUPROFEN 600 MG PO TABS: 600.0000 mg | ORAL_TABLET | Freq: Four times a day (QID) | ORAL | Status: AC

## 2015-10-06 NOTE — Lactation Note (Signed)
This note was copied from the chart of Kim Davidson. Lactation Consultation Note  Kim Davidson is at a 10% weight loss.  She is being supplemented with formula via syringe.  When she is at the breast mom stands to position her.  A nipple shield was observed but her lips were not well flanged.  I helped mom to flange baby's lips.  Long jaw excursions were not observed and swallows were not heard.  An SNS was explained to mom and she was agreeable to this.  Finger feeding was done to evaluate suck.  Kim Davidson did not maintain a seal on gloved finger and did not transfer from the container.  Tongue retraction noted at the tip.  She was hungry so a Foley cup was introduced.  She cup fed well and periodically extended her tongue over the gum line. Suck re-evaluated and she was better able to maintain a seal. Explained use of and cleaning of tools.  Plan Feed on cue but stop the feeding if suck feels weak.   Try SNS if Kim Davidson is making any improvements with suckle. Use foley cup to supplement Post-pump at least 8x in 24 hours Tongue exercises Tummy time Outpatient appointment 10/25 @ 0900  Patient Name: Kim Davidson [id number] Date: 10/06/2015 Reason for consult: Follow-up assessment   Maternal Data    Feeding Feeding Type: Formula Length of feed: 20 min  LATCH Score/Interventions                      Lactation Tools Discussed/Used Tools: Nipple Shields Nipple shield size: 20 Breast pump type: Double-Electric Breast Pump   Consult Status      Van Clines 10/06/2015, 10:10 AM

## 2015-10-06 NOTE — Discharge Summary (Signed)
OB Discharge Summary     Patient Name: Kim Davidson DOB: Jan 12, 1991 MRN: [id number]  Date of admission: 10/03/2015 Delivering MD: [name]   Date of discharge: 10/06/2015  Admitting diagnosis: CTX O82 C-Section Intrauterine pregnancy: [redacted]w[redacted]d    Secondary diagnosis:  Principal Problem:   Status post primary low transverse cesarean section--LGA, suspected CPD Active Problems:   Intrauterine normal pregnancy   Gestational diabetes mellitus, antepartum   Depression  Additional problems: As above, obesity     Discharge diagnosis: Term Pregnancy Delivered                                                                                                Post partum procedures:None  Augmentation: N/a  Complications: None  Hospital course:  Sceduled C/S   24y.o. yo G1P1001 at 370w2das admitted to the hospital 10/03/2015 for scheduled cesarean section with the following indication:Macrosomia.  Membrane Rupture Time/Date: 8:16 AM ,10/03/2015   Patient delivered a Viable infant.10/03/2015  Details of operation can be found in separate operative note.  Pateint had an uncomplicated postpartum course.  She is ambulating, tolerating a regular diet, passing flatus, and urinating well. Patient is discharged home in stable condition on No discharge date for patient encounter.          Physical exam  Filed Vitals:   10/05/15 1900 10/05/15 2100 10/06/15 0554 10/06/15 1039  BP: 121/65 122/64 144/77 129/59  Pulse: 93 80 93 92  Temp: 99 F (37.2 C) 98.6 F (37 C) 98.9 F (37.2 C)   TempSrc: Oral Oral Oral   Resp: 20 20 18    Height:      Weight:      SpO2: 98%      Elevated BP once in the early am.  Pt may have been nursing at the time.  Repeat was normal.  Pt denied headache, visual changes, RUQ pain. C/o tape burn on right side of abdomen.  General: alert, cooperative and no distress Lochia: appropriate Uterine Fundus: firm Incision: Dressing is clean, dry, and  intact Abdomen:  2-3 cm abrasion, slightly erythematous, no discharge,  Linear rash above incision R>L DVT Evaluation: No evidence of DVT seen on physical exam. Calf/Ankle edema is present Labs: Lab Results  Component Value Date   WBC 10.9* 10/04/2015   HGB 9.8* 10/04/2015   HCT 28.9* 10/04/2015   MCV 88.1 10/04/2015   PLT 160 10/04/2015   CMP Latest Ref Rng 10/03/2015  Glucose 65 - 99 mg/dL 84  BUN 6 - 20 mg/dL 11  Creatinine 0.44 - 1.00 mg/dL 0.59  Sodium 135 - 145 mmol/L 137  Potassium 3.5 - 5.1 mmol/L 4.1  Chloride 101 - 111 mmol/L 107  CO2 22 - 32 mmol/L 19(L)  Calcium 8.9 - 10.3 mg/dL 9.2  Total Protein 6.5 - 8.1 g/dL 6.7  Total Bilirubin 0.3 - 1.2 mg/dL 0.5  Alkaline Phos 38 - 126 U/L 146(H)  AST 15 - 41 U/L 17  ALT 14 - 54 U/L 14    Discharge instruction: per After Visit Summary and "Baby and Me Booklet".  Medications:  Current facility-administered medications:  .  acetaminophen (TYLENOL) tablet 650 mg, 650 mg, Oral, Q4H PRN, [name], DO .  witch hazel-glycerin (TUCKS) pad 1 application, 1 application, Topical, PRN **AND** dibucaine (NUPERCAINAL) 1 % rectal ointment 1 application, 1 application, Rectal, PRN, [name], DO .  diphenhydrAMINE (BENADRYL) capsule 25 mg, 25 mg, Oral, Q6H PRN, [name], DO .  diphenhydrAMINE (BENADRYL) injection 12.5 mg, 12.5 mg, Intravenous, Q4H PRN **OR** diphenhydrAMINE (BENADRYL) capsule 25 mg, 25 mg, Oral, Q4H PRN, Lyn Hollingshead, MD .  ferrous sulfate tablet 325 mg, 325 mg, Oral, Q breakfast, [name], DO, 325 mg at 10/06/15 0743 .  ibuprofen (ADVIL,MOTRIN) tablet 600 mg, 600 mg, Oral, 4 times per day, [name], DO, 600 mg at 10/06/15 2355 .  ibuprofen (ADVIL,MOTRIN) tablet 600 mg, 600 mg, Oral, Q6H PRN, Lyn Hollingshead, MD, 600 mg at 10/04/15 1232 .  lactated ringers infusion, , Intravenous, Continuous, [name], DO, Last Rate: 125 mL/hr at 10/03/15 1800 .  lanolin ointment 1 application, 1  application, Topical, PRN, [name], DO .  measles, mumps and rubella vaccine (MMR) injection 0.5 mL, 0.5 mL, Subcutaneous, Once, [employer], DO .  menthol-cetylpyridinium (CEPACOL) lozenge 3 mg, 1 lozenge, Oral, Q2H PRN, Jennifer Ozan, DO .  nalbuphine (NUBAIN) injection 5 mg, 5 mg, Intravenous, Q4H PRN **OR** nalbuphine (NUBAIN) injection 5 mg, 5 mg, Subcutaneous, Q4H PRN, Lyn Hollingshead, MD .  nalbuphine (NUBAIN) injection 5 mg, 5 mg, Intravenous, Once PRN **OR** nalbuphine (NUBAIN) injection 5 mg, 5 mg, Subcutaneous, Once PRN, Lyn Hollingshead, MD .  naloxone (NARCAN) 2 mg in dextrose 5 % 250 mL infusion, 1-4 mcg/kg/hr, Intravenous, Continuous PRN, Lyn Hollingshead, MD .  naloxone [facility]) injection 0.4 mg, 0.4 mg, Intravenous, PRN **AND** sodium chloride 0.9 % injection 3 mL, 3 mL, Intravenous, PRN, Lyn Hollingshead, MD .  ondansetron (ZOFRAN) injection 4 mg, 4 mg, Intravenous, Q6H PRN, Jennifer Ozan, DO .  ondansetron (ZOFRAN) injection 4 mg, 4 mg, Intravenous, Q8H PRN, Lyn Hollingshead, MD .  oxyCODONE-acetaminophen (PERCOCET/ROXICET) 5-325 MG per tablet 1 tablet, 1 tablet, Oral, Q4H PRN, [name], DO, 1 tablet at 10/05/15 0321 .  oxyCODONE-acetaminophen (PERCOCET/ROXICET) 5-325 MG per tablet 2 tablet, 2 tablet, Oral, Q4H PRN, [name], DO, 2 tablet at 10/06/15 1010 .  pantoprazole (PROTONIX) EC tablet 20 mg, 20 mg, Oral, BID, Jennifer Ozan, DO, 20 mg at 10/06/15 0902 .  prenatal multivitamin tablet 1 tablet, 1 tablet, Oral, Q1200, [name], DO, 1 tablet at 10/05/15 1124 .  scopolamine (TRANSDERM-SCOP) 1 MG/3DAYS 1.5 mg, 1 patch, Transdermal, Once, Lyn Hollingshead, MD, 1.5 mg at 10/03/15 1130 .  senna-docusate (Senokot-S) tablet 2 tablet, 2 tablet, Oral, Q24H, [name], DO, 2 tablet at 10/06/15 0022 .  sertraline (ZOLOFT) tablet 50 mg, 50 mg, Oral, Daily, [name], DO, 50 mg at 10/06/15 0902 .  simethicone (MYLICON) chewable tablet 80 mg, 80 mg, Oral,  TID PC, Jennifer Ozan, DO, 80 mg at 10/06/15 0744 .  simethicone (MYLICON) chewable tablet 80 mg, 80 mg, Oral, Q24H, Jennifer Ozan, DO, 80 mg at 10/06/15 0022 .  simethicone (MYLICON) chewable tablet 80 mg, 80 mg, Oral, PRN, [name], DO .  zolpidem (AMBIEN) tablet 5 mg, 5 mg, Oral, QHS PRN, [name], DO After visit meds:    Medication List    TAKE these medications        acetaminophen 500 MG tablet  Commonly known as:  TYLENOL  Take 500 mg by mouth every 6 (six) hours  as needed for mild pain or headache.     calcium carbonate 500 MG chewable tablet  Commonly known as:  TUMS - dosed in mg elemental calcium  Chew 2 tablets by mouth daily.     cyclobenzaprine 10 MG tablet  Commonly known as:  FLEXERIL  Take 1 tablet (10 mg total) by mouth 3 (three) times daily as needed for muscle spasms.     DICLEGIS 10-10 MG Tbec  Generic drug:  Doxylamine-Pyridoxine  Take 1-2 tablets by mouth 2 (two) times daily.     ibuprofen 600 MG tablet  Commonly known as:  ADVIL,MOTRIN  Take 1 tablet (600 mg total) by mouth every 6 (six) hours.     ondansetron 4 MG tablet  Commonly known as:  ZOFRAN  Take 4 mg by mouth every 8 (eight) hours as needed for nausea or vomiting.     oxyCODONE-acetaminophen 5-325 MG tablet  Commonly known as:  PERCOCET/ROXICET  Take 1-2 tablets by mouth every 4 (four) hours as needed (for pain scale greater than 7).     prenatal multivitamin Tabs tablet  Take 1 tablet by mouth daily at 12 noon.     ranitidine 150 MG tablet  Commonly known as:  ZANTAC  Take 150 mg by mouth 2 (two) times daily.        Diet: routine diet  Activity: Advance as tolerated. Pelvic rest for 6 weeks.   Outpatient follow up:2 weeks Follow up Appt:Future Appointments Date Time Provider [place]  10/08/2015 9:00 AM [place] None   Follow up Visit:No Follow-up on file.  Postpartum contraception: Progesterone only pills  Newborn Data: Live born  female  Birth Weight: 9 lb 2.6 oz (4155 g) APGAR: 8, 9  Baby Feeding: Breast Disposition:home with mother   10/06/2015 Thurnell Lose, MD

## 2015-10-06 NOTE — Discharge Instructions (Signed)
Cesarean Delivery, Care After °Refer to this sheet in the next few weeks. These instructions provide you with information on caring for yourself after your procedure. Your health care provider may also give you specific instructions. Your treatment has been planned according to current medical practices, but problems sometimes occur. Call your health care provider if you have any problems or questions after you go home. °HOME CARE INSTRUCTIONS  °· Only take over-the-counter or prescription medications as directed by your health care provider. °· Do not drink alcohol, especially if you are breastfeeding or taking medication to relieve pain. °· Do not chew or smoke tobacco. °· Continue to use good perineal care. Good perineal care includes: °¨ Wiping your perineum from front to back. °¨ Keeping your perineum clean. °· Check your surgical cut (incision) daily for increased redness, drainage, swelling, or separation of skin. °· Clean your incision gently with soap and water every day, and then pat it dry. If your health care provider says it is okay, leave the incision uncovered. Use a bandage (dressing) if the incision is draining fluid or appears irritated. If the adhesive strips across the incision do not fall off within 7 days, carefully peel them off. °· Hug a pillow when coughing or sneezing until your incision is healed. This helps to relieve pain. °· Do not use tampons or douche until your health care provider says it is okay. °· Shower, wash your hair, and take tub baths as directed by your health care provider. °· Wear a well-fitting bra that provides breast support. °· Limit wearing support panties or control-top hose. °· Drink enough fluids to keep your urine clear or pale yellow. °· Eat high-fiber foods such as whole grain cereals and breads, brown rice, beans, and fresh fruits and vegetables every day. These foods may help prevent or relieve constipation. °· Resume activities such as climbing stairs,  driving, lifting, exercising, or traveling as directed by your health care provider. °· Talk to your health care provider about resuming sexual activities. This is dependent upon your risk of infection, your rate of healing, and your comfort and desire to resume sexual activity. °· Try to have someone help you with your household activities and your newborn for at least a few days after you leave the hospital. °· Rest as much as possible. Try to rest or take a nap when your newborn is sleeping. °· Increase your activities gradually. °· Keep all of your scheduled postpartum appointments. It is very important to keep your scheduled follow-up appointments. At these appointments, your health care provider will be checking to make sure that you are healing physically and emotionally. °SEEK MEDICAL CARE IF:  °· You are passing large clots from your vagina. Save any clots to show your health care provider. °· You have a foul smelling discharge from your vagina. °· You have trouble urinating. °· You are urinating frequently. °· You have pain when you urinate. °· You have a change in your bowel movements. °· You have increasing redness, pain, or swelling near your incision. °· You have pus draining from your incision. °· Your incision is separating. °· You have painful, hard, or reddened breasts. °· You have a severe headache. °· You have blurred vision or see spots. °· You feel sad or depressed. °· You have thoughts of hurting yourself or your newborn. °· You have questions about your care, the care of your newborn, or medications. °· You are dizzy or light-headed. °· You have a rash. °· You   have pain, redness, or swelling at the site of the removed intravenous access (IV) tube.  You have nausea or vomiting.  You stopped breastfeeding and have not had a menstrual period within 12 weeks of stopping.  You are not breastfeeding and have not had a menstrual period within 12 weeks of delivery.  You have a fever. SEEK  IMMEDIATE MEDICAL CARE IF:  You have persistent pain.  You have chest pain.  You have shortness of breath.  You faint.  You have leg pain.  You have stomach pain.  Your vaginal bleeding saturates 2 or more sanitary pads in 1 hour. MAKE SURE YOU:   Understand these instructions.  Will watch your condition.  Will get help right away if you are not doing well or get worse.   This information is not intended to replace advice given to you by your health care provider. Make sure you discuss any questions you have with your health care provider.   Document Released: 08/22/2002 Document Revised: 12/21/2014 Document Reviewed: 07/27/2012 Elsevier Interactive Patient Education 2016 [employer].   Postpartum Depression and Baby Blues The postpartum period begins right after the birth of a baby. During this time, there is often a great amount of joy and excitement. It is also a time of many changes in the life of the parents. Regardless of how many times a mother gives birth, each child brings new challenges and dynamics to the family. It is not unusual to have feelings of excitement along with confusing shifts in moods, emotions, and thoughts. All mothers are at risk of developing postpartum depression or the "baby blues." These mood changes can occur right after giving birth, or they may occur many months after giving birth. The baby blues or postpartum depression can be mild or severe. Additionally, postpartum depression can go away rather quickly, or it can be a long-term condition.  CAUSES Raised hormone levels and the rapid drop in those levels are thought to be a main cause of postpartum depression and the baby blues. A number of hormones change during and after pregnancy. Estrogen and progesterone usually decrease right after the delivery of your baby. The levels of thyroid hormone and various cortisol steroids also rapidly drop. Other factors that play a role in these mood changes  include major life events and genetics.  RISK FACTORS If you have any of the following risks for the baby blues or postpartum depression, know what symptoms to watch out for during the postpartum period. Risk factors that may increase the likelihood of getting the baby blues or postpartum depression include:  Having a personal or family history of depression.   Having depression while being pregnant.   Having premenstrual mood issues or mood issues related to oral contraceptives.  Having a lot of life stress.   Having marital conflict.   Lacking a social support network.   Having a baby with special needs.   Having health problems, such as diabetes.  SIGNS AND SYMPTOMS Symptoms of baby blues include:  Brief changes in mood, such as going from extreme happiness to sadness.  Decreased concentration.   Difficulty sleeping.   Crying spells, tearfulness.   Irritability.   Anxiety.  Symptoms of postpartum depression typically begin within the first month after giving birth. These symptoms include:  Difficulty sleeping or excessive sleepiness.   Marked weight loss.   Agitation.   Feelings of worthlessness.   Lack of interest in activity or food.  Postpartum psychosis is a very serious  condition and can be dangerous. Fortunately, it is rare. Displaying any of the following symptoms is cause for immediate medical attention. Symptoms of postpartum psychosis include:   Hallucinations and delusions.   Bizarre or disorganized behavior.   Confusion or disorientation.  DIAGNOSIS  A diagnosis is made by an evaluation of your symptoms. There are no medical or lab tests that lead to a diagnosis, but there are various questionnaires that a health care provider may use to identify those with the baby blues, postpartum depression, or psychosis. Often, a screening tool called the [place] Postnatal Depression Scale is used to diagnose depression in the postpartum  period.  TREATMENT The baby blues usually goes away on its own in 1-2 weeks. Social support is often all that is needed. You will be encouraged to get adequate sleep and rest. Occasionally, you may be given medicines to help you sleep.  Postpartum depression requires treatment because it can last several months or longer if it is not treated. Treatment may include individual or group therapy, medicine, or both to address any social, physiological, and psychological factors that may play a role in the depression. Regular exercise, a healthy diet, rest, and social support may also be strongly recommended.  Postpartum psychosis is more serious and needs treatment right away. Hospitalization is often needed. HOME CARE INSTRUCTIONS  Get as much rest as you can. Nap when the baby sleeps.   Exercise regularly. Some women find yoga and walking to be beneficial.   Eat a balanced and nourishing diet.   Do little things that you enjoy. Have a cup of tea, take a bubble bath, read your favorite magazine, or listen to your favorite music.  Avoid alcohol.   Ask for help with household chores, cooking, grocery shopping, or running errands as needed. Do not try to do everything.   Talk to people close to you about how you are feeling. Get support from your partner, family members, friends, or other new moms.  Try to stay positive in how you think. Think about the things you are grateful for.   Do not spend a lot of time alone.   Only take over-the-counter or prescription medicine as directed by your health care provider.  Keep all your postpartum appointments.   Let your health care provider know if you have any concerns.  SEEK MEDICAL CARE IF: You are having a reaction to or problems with your medicine. SEEK IMMEDIATE MEDICAL CARE IF:  You have suicidal feelings.   You think you may harm the baby or someone else. MAKE SURE YOU:  Understand these instructions.  Will watch your  condition.  Will get help right away if you are not doing well or get worse.   This information is not intended to replace advice given to you by your health care provider. Make sure you discuss any questions you have with your health care provider.   Document Released: 09/03/2004 Document Revised: 12/05/2013 Document Reviewed: 09/11/2013 Elsevier Interactive Patient Education [employer].

## 2015-10-08 ENCOUNTER — Ambulatory Visit (HOSPITAL_COMMUNITY): Payer: Medicaid Other

## 2015-10-08 NOTE — Op Note (Signed)
**   Initially entered under progress note, resubmitted as Op note  PreOp Diagnosis: 1) Intrauterine pregnancy @ [redacted]w[redacted]d 2) Suspected LGA and CPD 3) Gestational DM 4) Maternal obesity PostOp Diagnosis: same Procedure: Primary C-section Surgeon: Dr. [name] Assistant: Charlestine Massed, RNFA Anesthesia: spinal Complications: none EBL: 700cc UOP: 200cc Fluids: 3300  Findings: Female infant from vertex presentation, APGARS 9,9, Weight: 9#2oz, Normal uterus, tubes and ovaries bilaterally  PROCEDURE:  Informed consent was obtained from the patient with risks, benefits, complications, treatment options, and expected outcomes discussed with the patient. The patient concurred with the proposed plan, giving informed consent with form signed.   The patient was taken to Operating Room, and identified with the procedure verified as C-Section Delivery with Time Out. A Traxi was applied to assist in retraction. With induction of anesthesia, the patient was prepped and draped in the usual sterile fashion. A Pfannenstiel incision was made and carried down through the subcutaneous tissue to the fascia. The fascia was incised in the midline and extended transversely. The superior aspect of the fascial incision was grasped with Kochers elevated and the underlying muscle dissected off. The inferior aspect of the facial incision was in similar fashion, grasped elevated and rectus muscles dissected off. The peritoneum was identified and entered. Peritoneal incision was extended longitudinally. The Alexis retractor was placed. The utero-vesical peritoneal reflection was identified and incised transversely with the [facility] scissors, the incision extended laterally, the bladder flap created digitally. A low transverse uterine incision was made and the infants head delivered atraumatically. After the umbilical cord was clamped and cut cord blood was obtained for evaluation.  The placenta was removed intact and appeared  normal. The uterine outline, tubes and ovaries appeared normal. The uterine incision was closed with running locked sutures of 0 Vicryl and a second layer of the same stitch was used in an imbricating fashion. Excellent hemostasis was obtained. The pericolic gutters were then cleared of all clots and debris. The Alexis retractor was removed. The peritoneum was closed in a running fashion. The fascia was then reapproximated with running sutures of 0 Vicryl. The subcutaneous tissue was reapproximated with 2-0 plain gut suture. The skin was closed with 4-0 vicryl in a subcuticular fashion.  Instrument, sponge, and needle counts were correct prior the abdominal closure and at the conclusion of the case. The patient was taken to recovery in stable condition.  [name], DO [phone] (pager) [phone] (office)

## 2015-10-16 ENCOUNTER — Ambulatory Visit (HOSPITAL_COMMUNITY)
Admission: RE | Admit: 2015-10-16 | Discharge: 2015-10-16 | Disposition: A | Discharge status: Home / Self Care | Payer: Medicaid Other | Source: Ambulatory Visit | Attending: Obstetrics & Gynecology | Admitting: Obstetrics & Gynecology

## 2015-10-16 NOTE — Lactation Note (Signed)
Lactation Consult  Mother's reason for visit: per mom breast feeding establishment, supply issues, using the Nipple Shield, Somewhat successful  Visit Type:  Feeding assessment , PCOS, GDM, C/section , ? Tongue tie , Left appt. Reminder.  Appointment Notes: poor feeder  Consult:  Initial Lactation Consultant:  Myer Haff  ________________________________________________________________________ Kim Davidson Name: Kim Davidson Date of Birth: 10/03/2015 Pediatrician: Dr. Rosana Hoes @ [facility]  Gender: female Gestational Age: [redacted]w[redacted]d (At Birth) Birth Weight: 9 lb 2.6 oz (4155 g) Weight at Discharge: Weight: 8 lb 3.2 oz (3720 g)Date of Discharge: 10/06/2015 Filed Weights   10/04/15 0040 10/04/15 2355 10/05/15 2347  Weight: 9 lb 0.1 oz (4085 g) 8 lb 9 oz (3885 g) 8 lb 3.2 oz (3720 g)   Last weight taken from location outside of [facility]:8-7 oz ( per mom 2nd Dr. Visit )  Location:Pediatrician's office Weight today: 4044 g , 8-14.7 oz   ___________________________________________________  Mother's Name: Kim Davidson Type of delivery:  C/sction  Breastfeeding Experience:  1st baby , per mom ran out of colostrum in the hospital  Had to start formula, supply has been very low to come , have PCOS, , C/section, Gestation DM.  Maternal Medical Conditions:  Polycystic ovarian syndrome and Gestational diabetes mellitis Maternal Medications:  PNV, Pain med   ________________________________________________________________________  Breastfeeding History (Post Discharge) Feedings every 2 -3 hours per mom   Frequency of breastfeeding:  2 x's a day  Duration of feeding:   15 - 20 mins   Supplementing:  EBM or formula, which ever is available up 2 oz  Similiac for sensitive stomachs works the best per mom   Pumping: with a DEBP ( hands Free) , Was using the #24 [place] , seem tight , will plan to switch to #27  ( [place] assessed breast  tissue and recommended increasing the size.  Per mom had sued the #27 a few times and improved the volume and felt better.  Per mom - pumping 2-3 x's a day 1/2 - 1 1/2 oz pumped off   Infant Intake and Output Assessment  Voids: 8-10  in 24 hrs.  Color:  Clear yellow Stools:  1  in 24 hrs.  Color:  tannish   ________________________________________________________________________  Maternal Breast Assessment  Breast:  Soft ( wide spaced breast , mom reports breast changes with pregnancy - increased 1 cup size , breast tender , areola larger and darker )  Nipple:  Erect ( some areola swelling noted )  Pain level:  0 Pain interventions:  Expressed breast milk  _______________________________________________________________________ Feeding Assessment/Evaluation  Initial feeding assessment:  Infant's oral assessment:  Variance - short labial frenulum down to gum line , upper lip stretches with exam and at latch                                                               Suspect posterior short frenulum , indentation mid portion of the anterior portion of tongue , baby only able to raise tongue  Upward but not above the corners of mouth.     Positioning:  Football - switched to cross cradle after baby breast fed for short interval , ( baby seemed to be more comfortable)  Right breast  LATCH documentation:  Latch:  2 =  Grasps breast easily, tongue down, lips flanged, rhythmical sucking.  Audible swallowing:  1 = A few with stimulation  Type of nipple:  2 = Everted at rest and after stimulation ( some areola edema )   Comfort (Breast/Nipple):  2 = Soft / non-tender  Hold (Positioning):  1 = Assistance needed to correctly position infant at breast and maintain latch  LATCH score:  8   Attached assessment:  Deep  Lips flanged:  No. ( upper lip flipped to flanged position  Lips untucked:  Yes.    Suck assessment:  Nutritive and Nonnutritive  Tools:  Nipple shield 24 mm Instructed  on use and cleaning of tool:  Yes.    Pre-feed weight:  4044 g , 8-14.7 oz  Post-feed weight:  4048 g , 8-14.8 oz  Amount transferred:  4 ml  Amount supplemented: 2 ml of the 4 ml formula instilled into the top of the NS )   Additional Feeding Assessment -   Infant's oral assessment:  Variance See above note   Positioning:  Cross cradle Right breast  LATCH documentation:  Latch:  2 = Grasps breast easily, tongue down, lips flanged, rhythmical sucking.  Audible swallowing:  2 = Spontaneous and intermittent ( with SNS added )   Type of nipple:  2 = Everted at rest and after stimulation  Comfort (Breast/Nipple):  2 = Soft / non-tender  Hold (Positioning):  1 = Assistance needed to correctly position infant at breast and maintain latch  LATCH score:  9   Attached assessment:  Deep  Lips flanged:  No.  Lips untucked:  Yes.    Suck assessment:  Nutritive  Tools:  Nipple shield 24 mm and Supplemental nutrition system Instructed on use and cleaning of tool:  Yes.    Pre-feed weight:   4048 g , 8-14.8 oz  Post-feed weight:  4082 g , 9.0.0 oz  Amount transferred: 34 ml  Amount supplemented: 19ml of the 34 ml transferred  ( SNS at the breast )    Total amount pumped post feed:  Did not post pump   Total amount transferred:  6 ml  Total supplement given:  30 ml  Total Supplement from a bottle 30 ml  Total feeding amount - 66 ml   Lactation Impression:  LC suspects milk supply is down - due to baby only latching at the breast 2 x's a day and mom only  pumping 3 x's a day ( normal range for breast stimulation 8-10 x's in 24 hours to establish and protect milk supply )  [place] has a Oral Variance - see note above     Lactation Plan of Care: Praised mom for her efforts breast feeding Breast feeding goals - Work on increasing milk supply.  Breast need stimulation at least 8 X 's times a day ( 10 is even better ) Active feeding at the breast or pumping  Consider "Power  pumping x once a day "  Using a nipple Shield requires extra pumping - post  pumping after at least 4-5 feedings a day if latching  If consistently breast feeding with Nipple Shield weekly weight checks indicated until baby gaining well  Don't allow [name] to go over 3 hours with out feeding( @ least 8 feedings a day or greater )  Mom aware to increase the volume as needed . SNS at the breast ( may need assistance from grandmother ) , and use over the NS as shown at consult  Broad based nipple if bottle feeding. ( slow flow )  Post pumping - at least after 4-5 feedings a day for 15 -20 mins
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
